# Patient Record
Sex: Female | Born: 1935 | Race: White | Hispanic: No | Smoking: Never smoker
Health system: Southern US, Community
[De-identification: ages and names within clinical notes are randomized; demographics above are authoritative.]

## PROBLEM LIST (undated history)

## (undated) DIAGNOSIS — I509 Heart failure, unspecified: Secondary | ICD-10-CM

## (undated) DIAGNOSIS — I429 Cardiomyopathy, unspecified: Secondary | ICD-10-CM

## (undated) DIAGNOSIS — M545 Low back pain: Secondary | ICD-10-CM

## (undated) DIAGNOSIS — N200 Calculus of kidney: Secondary | ICD-10-CM

## (undated) DIAGNOSIS — C50919 Malignant neoplasm of unspecified site of unspecified female breast: Secondary | ICD-10-CM

## (undated) DIAGNOSIS — M25569 Pain in unspecified knee: Secondary | ICD-10-CM

## (undated) DIAGNOSIS — R339 Retention of urine, unspecified: Secondary | ICD-10-CM

## (undated) DIAGNOSIS — N281 Cyst of kidney, acquired: Secondary | ICD-10-CM

## (undated) DIAGNOSIS — I722 Aneurysm of renal artery: Secondary | ICD-10-CM

## (undated) DIAGNOSIS — N3946 Mixed incontinence: Secondary | ICD-10-CM

## (undated) DIAGNOSIS — D3500 Benign neoplasm of unspecified adrenal gland: Secondary | ICD-10-CM

## (undated) HISTORY — PX: APPENDECTOMY: SHX54

## (undated) HISTORY — DX: Cyst of kidney, acquired: N28.1

## (undated) HISTORY — DX: Mixed incontinence: N39.46

## (undated) HISTORY — DX: Retention of urine, unspecified: R33.9

## (undated) HISTORY — DX: Heart failure, unspecified: I50.9

## (undated) HISTORY — DX: Cardiomyopathy, unspecified: I42.9

## (undated) HISTORY — DX: Low back pain: M54.5

## (undated) HISTORY — DX: Pain in unspecified knee: M25.569

## (undated) HISTORY — DX: Aneurysm of renal artery: I72.2

## (undated) HISTORY — DX: Benign neoplasm of unspecified adrenal gland: D35.00

## (undated) HISTORY — DX: Calculus of kidney: N20.0

## (undated) HISTORY — PX: BREAST SURGERY: SHX581

## (undated) HISTORY — DX: Malignant neoplasm of unspecified site of unspecified female breast: C50.919

---

## 2004-05-11 ENCOUNTER — Ambulatory Visit: Payer: Self-pay | Admitting: Oncology

## 2004-05-25 ENCOUNTER — Ambulatory Visit: Payer: Self-pay | Admitting: Oncology

## 2004-07-16 ENCOUNTER — Ambulatory Visit: Payer: Self-pay | Admitting: Unknown Physician Specialty

## 2004-08-30 ENCOUNTER — Ambulatory Visit: Payer: Self-pay | Admitting: General Surgery

## 2004-09-02 ENCOUNTER — Ambulatory Visit: Payer: Self-pay | Admitting: General Surgery

## 2004-09-21 ENCOUNTER — Ambulatory Visit: Payer: Self-pay | Admitting: General Surgery

## 2004-10-04 ENCOUNTER — Ambulatory Visit: Payer: Self-pay | Admitting: General Surgery

## 2004-10-07 ENCOUNTER — Ambulatory Visit: Payer: Self-pay | Admitting: General Surgery

## 2004-10-18 ENCOUNTER — Ambulatory Visit: Payer: Self-pay | Admitting: Oncology

## 2004-10-23 ENCOUNTER — Ambulatory Visit: Payer: Self-pay | Admitting: Oncology

## 2004-11-22 ENCOUNTER — Ambulatory Visit: Payer: Self-pay | Admitting: Oncology

## 2005-04-04 ENCOUNTER — Ambulatory Visit: Payer: Self-pay | Admitting: General Surgery

## 2005-05-10 ENCOUNTER — Ambulatory Visit: Payer: Self-pay | Admitting: Oncology

## 2005-05-26 ENCOUNTER — Ambulatory Visit: Payer: Self-pay | Admitting: Radiation Oncology

## 2005-07-04 ENCOUNTER — Ambulatory Visit: Payer: Self-pay | Admitting: Unknown Physician Specialty

## 2005-08-22 ENCOUNTER — Ambulatory Visit: Payer: Self-pay | Admitting: Unknown Physician Specialty

## 2005-09-26 ENCOUNTER — Ambulatory Visit: Payer: Self-pay | Admitting: General Surgery

## 2005-11-11 ENCOUNTER — Ambulatory Visit: Payer: Self-pay | Admitting: Oncology

## 2005-11-22 ENCOUNTER — Ambulatory Visit: Payer: Self-pay | Admitting: Oncology

## 2005-11-27 ENCOUNTER — Emergency Department: Payer: Self-pay | Admitting: Unknown Physician Specialty

## 2006-03-31 ENCOUNTER — Ambulatory Visit: Payer: Self-pay | Admitting: General Surgery

## 2006-05-12 ENCOUNTER — Ambulatory Visit: Payer: Self-pay | Admitting: Oncology

## 2006-05-25 ENCOUNTER — Ambulatory Visit: Payer: Self-pay | Admitting: Oncology

## 2006-08-10 ENCOUNTER — Ambulatory Visit: Payer: Self-pay

## 2006-09-04 ENCOUNTER — Ambulatory Visit: Payer: Self-pay | Admitting: Urology

## 2006-09-20 ENCOUNTER — Ambulatory Visit: Payer: Self-pay | Admitting: Urology

## 2006-10-02 ENCOUNTER — Ambulatory Visit: Payer: Self-pay | Admitting: General Surgery

## 2006-10-23 ENCOUNTER — Ambulatory Visit: Payer: Self-pay | Admitting: Urology

## 2006-11-08 ENCOUNTER — Ambulatory Visit: Payer: Self-pay | Admitting: Oncology

## 2006-11-23 ENCOUNTER — Ambulatory Visit: Payer: Self-pay | Admitting: Oncology

## 2006-12-11 ENCOUNTER — Ambulatory Visit: Payer: Self-pay | Admitting: General Surgery

## 2007-01-03 ENCOUNTER — Ambulatory Visit: Payer: Self-pay | Admitting: General Surgery

## 2007-04-04 ENCOUNTER — Ambulatory Visit: Payer: Self-pay | Admitting: General Surgery

## 2007-04-24 ENCOUNTER — Ambulatory Visit: Payer: Self-pay | Admitting: Urology

## 2007-04-25 ENCOUNTER — Ambulatory Visit: Payer: Self-pay | Admitting: Oncology

## 2007-05-07 ENCOUNTER — Ambulatory Visit: Payer: Self-pay | Admitting: Oncology

## 2007-05-26 ENCOUNTER — Ambulatory Visit: Payer: Self-pay | Admitting: Oncology

## 2007-07-11 ENCOUNTER — Ambulatory Visit: Payer: Self-pay | Admitting: Urology

## 2007-07-12 ENCOUNTER — Ambulatory Visit: Payer: Self-pay | Admitting: Urology

## 2007-07-27 ENCOUNTER — Ambulatory Visit: Payer: Self-pay | Admitting: Urology

## 2007-08-08 ENCOUNTER — Ambulatory Visit: Payer: Self-pay | Admitting: Urology

## 2007-08-16 ENCOUNTER — Ambulatory Visit: Payer: Self-pay | Admitting: Urology

## 2007-08-21 ENCOUNTER — Other Ambulatory Visit: Payer: Self-pay

## 2007-08-21 ENCOUNTER — Ambulatory Visit: Payer: Self-pay | Admitting: Urology

## 2007-08-23 ENCOUNTER — Ambulatory Visit: Payer: Self-pay | Admitting: Urology

## 2007-09-23 ENCOUNTER — Ambulatory Visit: Payer: Self-pay | Admitting: Oncology

## 2007-09-26 ENCOUNTER — Ambulatory Visit: Payer: Self-pay | Admitting: General Surgery

## 2007-10-24 ENCOUNTER — Ambulatory Visit: Payer: Self-pay | Admitting: Oncology

## 2007-10-29 ENCOUNTER — Ambulatory Visit: Payer: Self-pay | Admitting: Urology

## 2007-11-05 ENCOUNTER — Ambulatory Visit: Payer: Self-pay | Admitting: Oncology

## 2007-11-23 ENCOUNTER — Ambulatory Visit: Payer: Self-pay | Admitting: Oncology

## 2008-01-15 ENCOUNTER — Ambulatory Visit: Payer: Self-pay | Admitting: Unknown Physician Specialty

## 2008-03-03 ENCOUNTER — Ambulatory Visit: Payer: Self-pay | Admitting: Urology

## 2008-03-25 ENCOUNTER — Ambulatory Visit: Payer: Self-pay | Admitting: Oncology

## 2008-04-02 ENCOUNTER — Ambulatory Visit: Payer: Self-pay | Admitting: General Surgery

## 2008-05-25 ENCOUNTER — Ambulatory Visit: Payer: Self-pay | Admitting: Oncology

## 2008-05-28 ENCOUNTER — Ambulatory Visit: Payer: Self-pay | Admitting: Oncology

## 2008-06-23 ENCOUNTER — Ambulatory Visit: Payer: Self-pay | Admitting: Oncology

## 2008-06-24 ENCOUNTER — Ambulatory Visit: Payer: Self-pay | Admitting: Oncology

## 2008-07-25 ENCOUNTER — Ambulatory Visit: Payer: Self-pay | Admitting: Oncology

## 2008-09-22 ENCOUNTER — Ambulatory Visit: Payer: Self-pay | Admitting: Oncology

## 2008-09-22 ENCOUNTER — Ambulatory Visit: Payer: Self-pay | Admitting: Urology

## 2008-10-13 ENCOUNTER — Ambulatory Visit: Payer: Self-pay | Admitting: Oncology

## 2008-10-23 ENCOUNTER — Ambulatory Visit: Payer: Self-pay | Admitting: Oncology

## 2009-03-25 ENCOUNTER — Ambulatory Visit: Payer: Self-pay | Admitting: Oncology

## 2009-03-31 ENCOUNTER — Ambulatory Visit: Payer: Self-pay | Admitting: Urology

## 2009-04-06 ENCOUNTER — Ambulatory Visit: Payer: Self-pay | Admitting: Oncology

## 2009-04-07 ENCOUNTER — Ambulatory Visit: Payer: Self-pay | Admitting: General Surgery

## 2009-04-24 ENCOUNTER — Ambulatory Visit: Payer: Self-pay | Admitting: Oncology

## 2009-09-22 ENCOUNTER — Ambulatory Visit: Payer: Self-pay | Admitting: Oncology

## 2009-10-07 ENCOUNTER — Ambulatory Visit: Payer: Self-pay | Admitting: Oncology

## 2009-10-08 ENCOUNTER — Ambulatory Visit: Payer: Self-pay | Admitting: Urology

## 2009-10-10 ENCOUNTER — Emergency Department: Payer: Self-pay | Admitting: Emergency Medicine

## 2009-10-14 ENCOUNTER — Ambulatory Visit: Payer: Self-pay | Admitting: Urology

## 2009-10-23 ENCOUNTER — Ambulatory Visit: Payer: Self-pay | Admitting: Oncology

## 2009-10-29 ENCOUNTER — Ambulatory Visit: Payer: Self-pay | Admitting: Urology

## 2009-10-30 ENCOUNTER — Ambulatory Visit: Payer: Self-pay | Admitting: Urology

## 2009-11-04 ENCOUNTER — Ambulatory Visit: Payer: Self-pay | Admitting: Urology

## 2009-11-18 ENCOUNTER — Ambulatory Visit: Payer: Self-pay | Admitting: Urology

## 2010-03-25 ENCOUNTER — Ambulatory Visit: Payer: Self-pay | Admitting: Oncology

## 2010-04-05 ENCOUNTER — Ambulatory Visit: Payer: Self-pay | Admitting: Urology

## 2010-04-08 ENCOUNTER — Ambulatory Visit: Payer: Self-pay | Admitting: Oncology

## 2010-04-22 ENCOUNTER — Ambulatory Visit: Payer: Self-pay | Admitting: Oncology

## 2010-10-04 ENCOUNTER — Ambulatory Visit: Payer: Self-pay | Admitting: Urology

## 2010-10-07 ENCOUNTER — Ambulatory Visit: Payer: Self-pay | Admitting: Oncology

## 2010-10-24 ENCOUNTER — Ambulatory Visit: Payer: Self-pay | Admitting: Oncology

## 2010-11-12 ENCOUNTER — Ambulatory Visit: Payer: Self-pay | Admitting: Urology

## 2010-11-16 ENCOUNTER — Ambulatory Visit: Payer: Self-pay | Admitting: Urology

## 2010-11-19 ENCOUNTER — Ambulatory Visit: Payer: Self-pay | Admitting: Urology

## 2010-12-22 ENCOUNTER — Ambulatory Visit: Payer: Self-pay | Admitting: Urology

## 2011-01-05 ENCOUNTER — Inpatient Hospital Stay: Payer: Self-pay | Admitting: Urology

## 2011-01-18 ENCOUNTER — Ambulatory Visit: Payer: Self-pay | Admitting: Urology

## 2011-02-15 ENCOUNTER — Ambulatory Visit: Payer: Self-pay | Admitting: Urology

## 2011-02-22 ENCOUNTER — Inpatient Hospital Stay: Payer: Self-pay | Admitting: Specialist

## 2011-03-16 ENCOUNTER — Ambulatory Visit: Payer: Self-pay | Admitting: Urology

## 2011-03-24 ENCOUNTER — Ambulatory Visit: Payer: Self-pay | Admitting: Urology

## 2011-04-12 ENCOUNTER — Ambulatory Visit: Payer: Self-pay | Admitting: Urology

## 2011-04-13 ENCOUNTER — Ambulatory Visit: Payer: Self-pay | Admitting: Oncology

## 2011-04-14 ENCOUNTER — Ambulatory Visit: Payer: Self-pay | Admitting: Oncology

## 2011-04-15 LAB — CANCER ANTIGEN 27.29: CA 27.29: 23.4 U/mL (ref 0.0–38.6)

## 2011-04-21 ENCOUNTER — Ambulatory Visit: Payer: Self-pay | Admitting: Urology

## 2011-04-25 ENCOUNTER — Ambulatory Visit: Payer: Self-pay | Admitting: Oncology

## 2011-05-03 ENCOUNTER — Ambulatory Visit: Payer: Self-pay | Admitting: Urology

## 2011-06-09 ENCOUNTER — Ambulatory Visit: Payer: Self-pay | Admitting: Urology

## 2011-06-30 ENCOUNTER — Ambulatory Visit: Payer: Self-pay | Admitting: Urology

## 2011-07-11 ENCOUNTER — Ambulatory Visit: Payer: Self-pay | Admitting: Urology

## 2011-11-08 ENCOUNTER — Ambulatory Visit: Payer: Self-pay | Admitting: Urology

## 2012-03-13 ENCOUNTER — Ambulatory Visit: Payer: Self-pay | Admitting: Urology

## 2012-04-17 ENCOUNTER — Ambulatory Visit: Payer: Self-pay | Admitting: Oncology

## 2012-04-18 ENCOUNTER — Ambulatory Visit: Payer: Self-pay | Admitting: Oncology

## 2012-04-18 LAB — COMPREHENSIVE METABOLIC PANEL
Albumin: 3.5 g/dL (ref 3.4–5.0)
Alkaline Phosphatase: 108 U/L (ref 50–136)
Calcium, Total: 10 mg/dL (ref 8.5–10.1)
Co2: 26 mmol/L (ref 21–32)
EGFR (Non-African Amer.): 52 — ABNORMAL LOW
Glucose: 117 mg/dL — ABNORMAL HIGH (ref 65–99)
SGOT(AST): 23 U/L (ref 15–37)
SGPT (ALT): 25 U/L (ref 12–78)

## 2012-04-18 LAB — CBC CANCER CENTER
Eosinophil #: 0.1 x10 3/mm (ref 0.0–0.7)
Eosinophil %: 1.4 %
Lymphocyte #: 2.7 x10 3/mm (ref 1.0–3.6)
MCV: 87 fL (ref 80–100)
Monocyte %: 6.2 %
Neutrophil #: 5.4 x10 3/mm (ref 1.4–6.5)
Neutrophil %: 61 %
Platelet: 231 x10 3/mm (ref 150–440)
RBC: 5.64 10*6/uL — ABNORMAL HIGH (ref 3.80–5.20)
RDW: 14.2 % (ref 11.5–14.5)
WBC: 8.9 x10 3/mm (ref 3.6–11.0)

## 2012-04-24 ENCOUNTER — Ambulatory Visit: Payer: Self-pay | Admitting: Oncology

## 2012-09-18 ENCOUNTER — Emergency Department: Payer: Self-pay | Admitting: Emergency Medicine

## 2012-09-18 LAB — TROPONIN I: Troponin-I: <0.02 ng/mL

## 2012-09-18 LAB — BASIC METABOLIC PANEL
BUN: 14 mg/dL (ref 7–18)
Calcium, Total: 10.2 mg/dL — ABNORMAL HIGH (ref 8.5–10.1)
Co2: 25 mmol/L (ref 21–32)
Glucose: 118 mg/dL — ABNORMAL HIGH (ref 65–99)
Potassium: 3.5 mmol/L (ref 3.5–5.1)
Sodium: 141 mmol/L (ref 136–145)

## 2012-09-18 LAB — CBC
HCT: 45.8 % (ref 35.0–47.0)
MCHC: 33.3 g/dL (ref 32.0–36.0)
MCV: 85 fL (ref 80–100)
Platelet: 239 10*3/uL (ref 150–440)
RBC: 5.39 10*6/uL — ABNORMAL HIGH (ref 3.80–5.20)
WBC: 7.2 10*3/uL (ref 3.6–11.0)

## 2012-09-25 DIAGNOSIS — M545 Low back pain, unspecified: Secondary | ICD-10-CM

## 2012-09-25 DIAGNOSIS — N3946 Mixed incontinence: Secondary | ICD-10-CM

## 2012-09-25 DIAGNOSIS — D3500 Benign neoplasm of unspecified adrenal gland: Secondary | ICD-10-CM | POA: Insufficient documentation

## 2012-09-25 HISTORY — DX: Benign neoplasm of unspecified adrenal gland: D35.00

## 2012-09-25 HISTORY — DX: Low back pain, unspecified: M54.50

## 2012-09-25 HISTORY — DX: Mixed incontinence: N39.46

## 2012-09-27 ENCOUNTER — Ambulatory Visit: Payer: Self-pay | Admitting: Urology

## 2012-10-25 ENCOUNTER — Ambulatory Visit: Payer: Self-pay | Admitting: Urology

## 2012-10-26 LAB — URINE CULTURE

## 2012-11-01 ENCOUNTER — Ambulatory Visit: Payer: Self-pay | Admitting: Urology

## 2012-11-13 ENCOUNTER — Ambulatory Visit: Payer: Self-pay | Admitting: Urology

## 2012-11-22 ENCOUNTER — Ambulatory Visit: Payer: Self-pay | Admitting: Urology

## 2012-12-04 ENCOUNTER — Ambulatory Visit: Payer: Self-pay | Admitting: Urology

## 2012-12-20 ENCOUNTER — Ambulatory Visit: Payer: Self-pay | Admitting: Urology

## 2013-01-07 ENCOUNTER — Ambulatory Visit: Payer: Self-pay | Admitting: Urology

## 2013-03-08 ENCOUNTER — Other Ambulatory Visit: Payer: Self-pay | Admitting: Ophthalmology

## 2013-03-08 LAB — SEDIMENTATION RATE: Erythrocyte Sed Rate: 1 mm/hr (ref 0–30)

## 2013-03-11 ENCOUNTER — Ambulatory Visit: Payer: Self-pay | Admitting: Ophthalmology

## 2013-04-18 ENCOUNTER — Ambulatory Visit: Payer: Self-pay | Admitting: Internal Medicine

## 2013-05-06 ENCOUNTER — Ambulatory Visit: Payer: Self-pay | Admitting: Urology

## 2013-06-03 ENCOUNTER — Ambulatory Visit: Payer: Self-pay | Admitting: Urology

## 2013-06-24 ENCOUNTER — Ambulatory Visit: Payer: Self-pay | Admitting: Urology

## 2013-09-26 ENCOUNTER — Ambulatory Visit: Payer: Self-pay | Admitting: Urology

## 2013-11-18 ENCOUNTER — Ambulatory Visit: Payer: Self-pay | Admitting: Urology

## 2013-11-18 DIAGNOSIS — Z01810 Encounter for preprocedural cardiovascular examination: Secondary | ICD-10-CM

## 2013-11-18 LAB — POTASSIUM: Potassium: 4.1 mmol/L (ref 3.5–5.1)

## 2013-11-20 ENCOUNTER — Ambulatory Visit: Payer: Self-pay | Admitting: Urology

## 2013-11-20 LAB — URINE CULTURE

## 2013-11-25 ENCOUNTER — Ambulatory Visit: Payer: Self-pay | Admitting: Urology

## 2013-12-05 ENCOUNTER — Ambulatory Visit: Payer: Self-pay | Admitting: Urology

## 2013-12-09 ENCOUNTER — Ambulatory Visit: Payer: Self-pay | Admitting: Urology

## 2014-01-13 ENCOUNTER — Ambulatory Visit: Payer: Self-pay | Admitting: Urology

## 2014-01-29 DIAGNOSIS — R339 Retention of urine, unspecified: Secondary | ICD-10-CM

## 2014-01-29 HISTORY — DX: Retention of urine, unspecified: R33.9

## 2014-02-07 ENCOUNTER — Ambulatory Visit: Payer: Self-pay | Admitting: Urology

## 2014-05-01 ENCOUNTER — Ambulatory Visit: Payer: Self-pay | Admitting: Internal Medicine

## 2014-05-13 ENCOUNTER — Ambulatory Visit: Payer: Self-pay | Admitting: Internal Medicine

## 2014-11-14 ENCOUNTER — Ambulatory Visit
Admit: 2014-11-14 | Disposition: A | Discharge status: Home / Self Care | Payer: Self-pay | Attending: Internal Medicine | Admitting: Internal Medicine

## 2014-11-14 NOTE — Op Note (Signed)
PATIENT NAME:  Kelly Mcbride, Kelly Mcbride MR#:  [id number] DATE OF BIRTH:  Jan 20, 1923  DATE OF PROCEDURE:  12/20/2012  PREOPERATIVE DIAGNOSIS:  Left proximal ureteral calculus.   POSTOPERATIVE DIAGNOSIS:  Left renal calculi.   PROCEDURES:  1.  Left ureteroscopy with holmium laser lithotripsy.  2.  Placement of left ureteral stent.   SURGEON:  John Giovanni, M.D.   ASSISTANT:  None.   ANESTHESIA:  General.   INDICATIONS:  This is an 81 year old female with a history of recurrent stone disease. A KUB approximately six weeks ago was remarkable for a stone suspicious in the proximal ureter. Although asymptomatic, a renal ultrasound and CT did show hydronephrosis from a left proximal stone. She initially elected shock wave lithotripsy, which has been performed x2 with only partial fragmentation. She has elected to proceed with ureteroscopy.   DESCRIPTION OF PROCEDURE:  The patient was taken to the cystoscopy suite and placed in the supine position. General anesthetic was administered via an LMA. She was then placed in the low lithotomy position and her external genitalia were prepped and draped in the usual fashion. A timeout was performed per protocol with all in agreement. A 21-French cystoscope sheath with obturator was lubricated and passed per urethra. Panendoscopy was performed which showed normal-appearing bladder mucosa without erythema, solid or papillary lesions. The ureteral orifices were normal in appearance. A 0.035 guidewire was placed through the cystoscope and into the left ureteral orifice. The wire was easily passed up the left renal pelvis. It appeared the stone fragments had migrated from the proximal ureter to the kidney. There was also a left lower pole renal calculi present. The cystoscope was removed and a 6-French semirigid ureteroscope was passed per urethra. The left ureteral orifice was engaged without dilation. The scope was passed up the entire ureter without difficulty and no stones or  stone fragments were seen. The scope was advanced into the renal pelvis; however, the renal calculi could not be visualized. A second guidewire was placed through the rigid ureteroscope which was removed. A Navigator ureteral access sheath would not advance over the wire and buckled in the distal ureter. Over-the-wire ureteral dilators from 6 to 12-French were placed. A flexible ureteroscope was passed over the second wire; however, there was a slight narrowing in the distal ureter where the scope would not negotiate. The ureteroscope was removed. The wire  was backloaded on a cystoscope and a 10 cm ureteral dilating balloon was placed over the wire. The balloon was inflated with contrast to 18 atmospheres. There was still a slight waist. The balloon was deflated and removed. The ureteroscope was able to be placed over the wire and passed up into the renal pelvis. The mid pole stone fragments were easily visualized. A 365 micron holmium laser fiber was placed through the ureteroscope and the stone was fragmented in multiple small fragments. The scope was then placed into the lower pole calyx of the second grouping of stones. A 365 micron holmium laser fiber would not advance through the bend in the ureteroscope distally. A smaller holmium fiber could not be advanced through the ureteroscope secondary to a problem with the sheath, which could not be resolved. A 3-French nitinol basket was placed through the ureteroscope and several fragments removed, larger measuring approximately 3 mm. The ureteroscope was removed. A 6-French/24 cm Microvasive Contour stent was placed over the wire. There was good curl seen in the renal pelvis under fluoroscopy. The ureteroscope was repassed and the distal end of the stent  was well positioned in the bladder. B and O suppository was placed per rectum. The patient was taken to the PAC-U in stable condition. There were no complications. EBL was minimal.     ____________________________ [name]. Bernardo Heater, MD scs:nts D: 12/20/2012 18:28:14 ET T: 12/21/2012 05:35:15 ET JOB#: [id number]  cc: Nicki Reaper C. Bernardo Heater, MD, <Dictator> Abbie Sons MD ELECTRONICALLY SIGNED 12/24/2012 12:23

## 2014-11-15 NOTE — Op Note (Signed)
PATIENT NAME:  Kelly Mcbride, Kelly Mcbride MR#:  [id number] DATE OF BIRTH:  April 29, 1923  DATE OF PROCEDURE:  11/20/2013  PREOPERATIVE DIAGNOSIS:  Left ureteral calculi.   POSTOPERATIVE DIAGNOSIS:  Left ureteral calculi.  PROCEDURE PERFORMED:  Left ureteroscopy with a basket extraction of left ureteral calculi.   SURGEON:  John Giovanni, M.D.   ASSISTANT:  None.   ANESTHESIA:  General.   INDICATIONS:  An 81 year old female with a history of recurrent stone disease. She presented with a 1 week history of left flank pain. Stone protocol CT of the abdomen and pelvis was performed, which showed a 2 mm left distal ureteral calculus and a 5 mm left proximal ureteral calculus. There were also 2 to 3 calculi in the left renal pelvis and left calyces. She elected ureteroscopic removal.   DESCRIPTION OF PROCEDURE:  She was taken to the cystoscopy suite and placed on the table in the supine position. A general anesthetic was administered and she was then placed in the low lithotomy position and her external genitalia were prepped and draped in the usual fashion. A timeout was performed per protocol. A 21-French cystoscope sheath with obturator was lubricated and passed per urethra. Panendoscopy was performed and the bladder mucosa was normal in appearance without erythema, solid or papillary lesions. The ureteral orifices were normal appearing bilaterally and clear efflux was noted. A 2.836 hydrophilic guidewire was placed through the cystoscope and into the left ureteral orifice. Guidewire was passed up into the renal pelvis under fluoroscopic guidance without difficulty. The cystoscope was removed and a 6-French semirigid ureteroscope was passed per urethra. The left ureteral orifice was patulous and the ureteroscope was easily entered. A 2 mm stone was not seen in the distal ureter. Approximately a 4 mm stone was noted in the upper portion of the distal ureter. A 3-French nitinol basket was placed through the ureteroscope  and the stone was basket'd and removed without difficulty. Two additional calculi were seen in the proximal ureter, one measuring approximately 4 mm and another 5 mm. These were also basket'd and removed without difficulty. The ureteroscope was then advanced up to the UPJ; however, the scope could not pass through the renal pelvis. Attempts at placing a flexible ureteroscope to evaluate for the renal pelvic calculi were not successful as the scope would not pass beyond the mid ureter. Using retrograde pyelogram and fluoroscopy, definite calculi could not be seen in the renal pelvis. It was elected at this point to place a stent. The ureteroscope was removed. A 4.8-French/22 cm Contour ureteral stent was placed over the wire. There was good curl seen in the renal pelvis under fluoroscopy. The distal end of the stent was well positioned in the bladder under direct vision. A B and O suppository was placed per rectum. The patient was taken to the PACU in stable condition. There were no complications. EBL was minimal.      ____________________________ [name]. Bernardo Heater, MD scs:dmm D: 11/27/2013 10:56:24 ET T: 11/27/2013 13:14:44 ET JOB#: [id number]  cc: Nicki Reaper C. Bernardo Heater, MD, <Dictator> Abbie Sons MD ELECTRONICALLY SIGNED 12/18/2013 15:18

## 2016-04-28 DIAGNOSIS — M25569 Pain in unspecified knee: Secondary | ICD-10-CM

## 2016-04-28 HISTORY — DX: Pain in unspecified knee: M25.569

## 2016-12-13 ENCOUNTER — Ambulatory Visit
Admission: RE | Admit: 2016-12-13 | Discharge: 2016-12-13 | Disposition: A | Discharge status: Home / Self Care | Payer: Medicare Other | Source: Ambulatory Visit | Attending: Internal Medicine | Admitting: Internal Medicine

## 2016-12-13 ENCOUNTER — Other Ambulatory Visit: Payer: Self-pay | Admitting: Internal Medicine

## 2016-12-13 DIAGNOSIS — W19XXXA Unspecified fall, initial encounter: Secondary | ICD-10-CM | POA: Insufficient documentation

## 2016-12-13 DIAGNOSIS — M545 Low back pain: Secondary | ICD-10-CM | POA: Diagnosis not present

## 2016-12-13 DIAGNOSIS — R102 Pelvic and perineal pain: Secondary | ICD-10-CM | POA: Diagnosis present

## 2016-12-13 DIAGNOSIS — M549 Dorsalgia, unspecified: Secondary | ICD-10-CM

## 2016-12-13 DIAGNOSIS — N200 Calculus of kidney: Secondary | ICD-10-CM | POA: Insufficient documentation

## 2017-01-10 ENCOUNTER — Encounter: Payer: Self-pay | Admitting: Internal Medicine

## 2017-01-10 ENCOUNTER — Ambulatory Visit (INDEPENDENT_AMBULATORY_CARE_PROVIDER_SITE_OTHER): Payer: Medicare Other | Admitting: Internal Medicine

## 2017-01-10 DIAGNOSIS — C50919 Malignant neoplasm of unspecified site of unspecified female breast: Secondary | ICD-10-CM | POA: Insufficient documentation

## 2017-01-10 DIAGNOSIS — I722 Aneurysm of renal artery: Secondary | ICD-10-CM | POA: Insufficient documentation

## 2017-01-10 DIAGNOSIS — N200 Calculus of kidney: Secondary | ICD-10-CM | POA: Diagnosis not present

## 2017-01-10 DIAGNOSIS — Z9049 Acquired absence of other specified parts of digestive tract: Secondary | ICD-10-CM | POA: Insufficient documentation

## 2017-01-10 DIAGNOSIS — N281 Cyst of kidney, acquired: Secondary | ICD-10-CM | POA: Insufficient documentation

## 2017-01-10 HISTORY — DX: Calculus of kidney: N20.0

## 2017-01-10 HISTORY — DX: Cyst of kidney, acquired: N28.1

## 2017-01-10 HISTORY — DX: Malignant neoplasm of unspecified site of unspecified female breast: C50.919

## 2017-01-10 HISTORY — DX: Aneurysm of renal artery: I72.2

## 2017-01-10 NOTE — Progress Notes (Signed)
[facility] for Infectious Disease      Reason for Consult: positive urine culture   Referring Physician: Dr. Bernardo Heater    Patient ID: Kelly Mcbride, female    DOB: 11/08/1922, 83 y.o.   MRN: [id number]  HPI:   She comes in at the request of above.  She has a history of recurrent kidney stones seen by urology in 2015 and stone passed. She was rerefered to Dr. Bernardo Heater by her PCP Dr. Hall Busing for ? Infection with about 2 weeks of intermittent confusion above her baseline and a urinalysis was sent and culture positive for mixed flora.  She was started on cipro empirically.  She was having some pelvic discomort but this was not new.  She was having no fever, no chills.  Her daughter states she did have an elevated peripheral WBC level.  She then went to urology and had a cT scan which noted non obstructive stones.  She had a stent placed and is unsure if stones have passed.  She has not been filtering her urine.  No fever or chills since then.   Previous record reviewed as above.  Urine culture grew a Corynebacterium species < 100,000.  UA with WBCs and blood.    PMH: dementia, renal cysts, anemia, calculus of kidney, skin cancer  Prior to Admission medications   Medication Sig Start Date End Date Taking? Authorizing Provider  acetaminophen (TYLENOL) 325 MG tablet Take by mouth daily.   Yes [provider]  Ascorbic Acid (VITAMIN C) 1000 MG tablet Take 1,000 mg by mouth. 09/25/12  Yes [provider]  bimatoprost (LUMIGAN) 0.01 % SOLN Lumigan 0.01 % eye drops   Yes [provider]  docusate sodium (COLACE) 100 MG capsule Take 100 mg by mouth 2 (two) times daily.   Yes [provider]  metroNIDAZOLE (METROCREAM) 0.75 % cream  12/31/13  Yes [provider]  Omega-3 Fatty Acids (FISH OIL PO) Take by mouth.   Yes [provider]  omeprazole (PRILOSEC) 20 MG capsule  11/01/13  Yes [provider]  vitamin E (TH VITAMIN E) 1000 UNIT capsule  Take by mouth. 09/25/12  Yes [provider]    Allergies  Allergen Reactions  . Sulfa Antibiotics Hives and Rash  . Aspirin   . Penicillins     Other reaction(s): Other (See Comments) Other reaction(s): Unknown    Social History  Substance Use Topics  . Smoking status: Never Smoker  . Smokeless tobacco: Never Used  . Alcohol use No    FMH: no renal disease  Review of Systems  Constitutional: negative for fevers, chills and fatigue Cardiovascular: negative for chest pressure/discomfort Gastrointestinal: negative for diarrhea Integument/breast: negative for rash All other systems reviewed and are negative    Constitutional: in no apparent distress and alert  Vitals:   01/10/17 1353  BP: 126/77  Pulse: (!) 106  Temp: 98.5 F (36.9 C)   EYES: anicteric ENMT:no thrush Cardiovascular: Cor RRR Respiratory: CTA B; normal respiratory effort GI: Bowel sounds are normal, liver is not enlarged, spleen is not enlarged Musculoskeletal: no pedal edema noted Skin: negatives: no rash  Labs: Lab Results  Component Value Date   WBC 7.2 09/18/2012   HGB 15.3 09/18/2012   HCT 45.8 09/18/2012   MCV 85 09/18/2012   PLT 239 09/18/2012    Lab Results  Component Value Date   CREATININE 0.80 09/18/2012   BUN 14 09/18/2012   NA 141 09/18/2012  K 4.1 11/18/2013   CL 107 09/18/2012   CO2 25 09/18/2012    Lab Results  Component Value Date   ALT 25 04/18/2012   AST 23 04/18/2012   ALKPHOS 108 04/18/2012   BILITOT 0.4 04/18/2012     Assessment: urine colonization.  I do not appreciate any signs of infection at this time or from the history.  UA and culture noted and in this setting would be consistent with colonization.  Her history of a urinary source causing confusion would be inconsistent without concurrent systemic signs such as high fever, CVA tenderness, significantly elevated WBC.  Therefore no treatment or work up indicated.   Plan: 1) continue supportive  care, no infectious etiology at this time 2) no indication to check urinalysis without concerns for infection such as fever, new pelvic discomfort and no other source.    Thanks for the referral, call with questions.

## 2017-05-02 ENCOUNTER — Other Ambulatory Visit: Payer: Self-pay | Admitting: Internal Medicine

## 2017-05-02 ENCOUNTER — Ambulatory Visit
Admission: RE | Admit: 2017-05-02 | Discharge: 2017-05-02 | Disposition: A | Discharge status: Home / Self Care | Payer: Medicare Other | Source: Ambulatory Visit | Attending: Internal Medicine | Admitting: Internal Medicine

## 2017-05-02 DIAGNOSIS — M79605 Pain in left leg: Secondary | ICD-10-CM | POA: Diagnosis present

## 2017-05-25 ENCOUNTER — Other Ambulatory Visit: Payer: Self-pay | Admitting: Urology

## 2017-05-25 DIAGNOSIS — N200 Calculus of kidney: Secondary | ICD-10-CM

## 2017-05-29 ENCOUNTER — Ambulatory Visit
Admission: RE | Admit: 2017-05-29 | Discharge: 2017-05-29 | Disposition: A | Discharge status: Home / Self Care | Payer: Medicare Other | Source: Ambulatory Visit | Attending: Urology | Admitting: Urology

## 2017-05-29 DIAGNOSIS — N200 Calculus of kidney: Secondary | ICD-10-CM | POA: Diagnosis not present

## 2017-05-29 DIAGNOSIS — I722 Aneurysm of renal artery: Secondary | ICD-10-CM | POA: Diagnosis not present

## 2017-05-31 ENCOUNTER — Telehealth: Payer: Self-pay

## 2017-05-31 NOTE — Telephone Encounter (Signed)
-----   Message from Abbie Sons, MD sent at 05/30/2017 12:41 PM EST ----- KUB reviewed.  The right ureteral calculus has not progressed and based on size unlikely she will be able to pass.  Would recommend appointment to discuss treatment options.

## 2017-05-31 NOTE — Telephone Encounter (Signed)
Spoke with pt daughter, Olivia Mackie, in reference to KUB results and needing an OV to discuss tx options. Olivia Mackie voiced understanding. F/u appt was made.

## 2017-06-07 ENCOUNTER — Ambulatory Visit (INDEPENDENT_AMBULATORY_CARE_PROVIDER_SITE_OTHER): Payer: Medicare Other | Admitting: Urology

## 2017-06-07 ENCOUNTER — Encounter: Payer: Self-pay | Admitting: Urology

## 2017-06-07 VITALS — BP 133/79 | HR 93 | Ht 63.0 in | Wt 115.0 lb

## 2017-06-07 DIAGNOSIS — I5033 Acute on chronic diastolic (congestive) heart failure: Secondary | ICD-10-CM | POA: Insufficient documentation

## 2017-06-07 DIAGNOSIS — N201 Calculus of ureter: Secondary | ICD-10-CM | POA: Diagnosis not present

## 2017-06-07 DIAGNOSIS — I509 Heart failure, unspecified: Secondary | ICD-10-CM

## 2017-06-07 DIAGNOSIS — N200 Calculus of kidney: Secondary | ICD-10-CM

## 2017-06-07 DIAGNOSIS — I429 Cardiomyopathy, unspecified: Secondary | ICD-10-CM | POA: Insufficient documentation

## 2017-06-07 HISTORY — DX: Cardiomyopathy, unspecified: I42.9

## 2017-06-07 HISTORY — DX: Heart failure, unspecified: I50.9

## 2017-06-07 NOTE — Progress Notes (Signed)
06/07/2017 7:56 PM   Kelly Mcbride [birth year] [id number]  Referring provider: Albina Billet, MD [address]   [place], [place] [address]  Chief Complaint  Patient presents with  . Nephrolithiasis    treatment options    HPI: Kelly Mcbride is a 83 year old female with a long history of recurrent stone disease.  She has had multiple ureteroscopic stone removal and previous shockwave lithotripsy over the years.  Her last procedure was a left USE at [facility] in July 2018.  Her daughter called in September 2018 stating her mother was having some abdominal pain.  A stone protocol CT of the abdomen and pelvis was performed on 9/20 which showed a nonobstructing 6 mm right distal ureteral calculus and a nonobstructing 3 mm left ureteral calculus.  Due to minimal symptoms and lack of obstruction she elected a trial of passage.  She has mild pelvic discomfort but no severe renal colic.  She did pass a small stone several weeks ago.  Recent KUB shows a persistent right distal ureteral calculus.   PMH: Past Medical History:  Diagnosis Date  . 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 Breast cancer ([facility]) 01/10/2017  . Cardiomyopathy, secondary ([facility]) 06/07/2017  . CHF (congestive heart failure) ([facility]) 06/07/2017  . Incomplete emptying of bladder 01/29/2014  . Kidney cyst, acquired 01/10/2017  . Knee pain 04/28/2016  . Low back pain 09/25/2012  . Mixed urge and stress incontinence 09/25/2012  . Nephrolithiasis 01/10/2017    Surgical History: Past Surgical History:  Procedure Laterality Date  . APPENDECTOMY      Home Medications:  Allergies as of 06/07/2017      Reactions   Sulfa Antibiotics Hives, Rash   Aspirin    Ciprofloxacin    Other reaction(s): Other (See Comments) Per patient she get upset stomach and dizziness.    Ibuprofen    Other reaction(s): Other (See Comments) Because of hiatal hernia  Other reaction(s): Other (See  Comments) Because of hiatal hernia    Penicillins    Other reaction(s): Other (See Comments) Other reaction(s): Unknown      Medication List        Accurate as of 06/07/17  7:56 PM. Always use your most recent med list.          acetaminophen 325 MG tablet Commonly known as:  TYLENOL Take by mouth daily.   docusate sodium 100 MG capsule Commonly known as:  COLACE Take 100 mg by mouth 2 (two) times daily.   FISH OIL PO Take by mouth.   LUMIGAN 0.01 % Soln Generic drug:  bimatoprost Lumigan 0.01 % eye drops   metroNIDAZOLE 0.75 % cream Commonly known as:  METROCREAM   omeprazole 20 MG capsule Commonly known as:  PRILOSEC   TH VITAMIN E 1000 UNIT capsule Generic drug:  vitamin E Take by mouth.   vitamin C 1000 MG tablet Take 1,000 mg by mouth.       Allergies:  Allergies  Allergen Reactions  . Sulfa Antibiotics Hives and Rash  . Aspirin   . Ciprofloxacin     Other reaction(s): Other (See Comments) Per patient she get upset stomach and dizziness.   . Ibuprofen     Other reaction(s): Other (See Comments) Because of hiatal hernia  Other reaction(s): Other (See Comments) Because of hiatal hernia    . Penicillins     Other reaction(s): Other (See Comments) Other reaction(s): Unknown  Family History: No family history on file.  Social History:  reports that  has never smoked. she has never used smokeless tobacco. She reports that she does not drink alcohol or use drugs.  ROS: UROLOGY Frequent Urination?: Yes Hard to postpone urination?: No Burning/pain with urination?: Yes Get up at night to urinate?: Yes Leakage of urine?: No Urine stream starts and stops?: Yes Trouble starting stream?: Yes Do you have to strain to urinate?: No Blood in urine?: No Urinary tract infection?: No Sexually transmitted disease?: No Injury to kidneys or bladder?: No Painful intercourse?: No Weak stream?: Yes Currently pregnant?: No Vaginal bleeding?: No Last  menstrual period?: n  Gastrointestinal Nausea?: No Vomiting?: No Indigestion/heartburn?: No Diarrhea?: No Constipation?: No  Constitutional Fever: No Night sweats?: No Weight loss?: No Fatigue?: No  Skin Skin rash/lesions?: No Itching?: No  Eyes Blurred vision?: No Double vision?: No  Ears/Nose/Throat Sore throat?: No Sinus problems?: Yes  Hematologic/Lymphatic Swollen glands?: No Easy bruising?: No  Cardiovascular Leg swelling?: No Chest pain?: No  Respiratory Cough?: No Shortness of breath?: No  Endocrine Excessive thirst?: No  Musculoskeletal Back pain?: Yes Joint pain?: No  Neurological Headaches?: No Dizziness?: Yes  Psychologic Depression?: Yes Anxiety?: No  Physical Exam: BP 133/79   Pulse 93   Ht 5\' 3"  (1.6 m)   Wt 115 lb (52.2 kg)   BMI 20.37 kg/m   Constitutional:  Alert and oriented, No acute distress. HEENT: [place] AT, moist mucus membranes.  Trachea midline, no masses. Cardiovascular: No clubbing, cyanosis, or edema.  CV RRR Respiratory: Normal respiratory effort, no increased work of breathing.  Lungs clear GI: Abdomen is soft, nontender, nondistended, no abdominal masses GU: No CVA tenderness. Skin: No rashes, bruises or suspicious lesions. Lymph: No cervical or inguinal adenopathy. Neurologic: Grossly intact, no focal deficits, moving all 4 extremities. Psychiatric: Normal mood and affect.  Laboratory Data: Lab Results  Component Value Date   WBC 7.2 09/18/2012   HGB 15.3 09/18/2012   HCT 45.8 09/18/2012   MCV 85 09/18/2012   PLT 239 09/18/2012    Lab Results  Component Value Date   CREATININE 0.80 09/18/2012    Urinalysis Dipstick trace blood/2+ leukocytes Microscopy 6-10 WBC  Pertinent imaging:  Results for orders placed during the hospital encounter of 05/29/17  Abdomen 1 view (KUB)   Narrative CLINICAL DATA:  Followup left-sided kidney stone, previous stent placement in July, no current  symptoms  EXAM: ABDOMEN - 1 VIEW  COMPARISON:  Abdomen films of 12/13/2016, and CT abdomen pelvis of 02/22/2011  FINDINGS: The oblong calculus noted previously overlying the lower pole of the right kidney is unchanged as are probable additional right renal calculi. There also may be a right renal artery aneurysm more medially. Calcifications overlie the mid lower left kidney as well most consistent with left lower pole renal calculi. The double-J stent noted previously has been removed. However, there is a calcification in the lower right bony pelvis not definitely seen previously. A distal right ureteral calculus cannot be excluded. Bowel gas pattern is nonspecific. There is a moderate amount of feces throughout the colon. The bones are osteopenic.  IMPRESSION: 1. Calcification low in the right bony pelvis not seen previously could represent a distal right ureteral calculus. Consider CT the abdomen pelvis if further assessment is warranted. 2. No change in bilateral renal calculi. 3. Calcified right renal artery aneurysm as noted on prior CT the abdomen pelvis from 2012.   Electronically Signed   By: Eddie Dibbles  Alvester Chou M.D.   On: 05/30/2017 08:43     Assessment & Plan:    1. Calculus of ureter Nonobstructing right distal ureteral calculus.  History consistent with passing her small left ureteral calculus.  Based on stone size and nonprogression is [place] and her daughter informed it is unlikely she will pass the stone.  Stone density on CT was 700-800 HU.  We discussed ureteroscopic removal which would have a success rate of greater than 95% and shockwave lithotripsy which would have a success rate of approximately 85%.  Her daughter is very concerned about general anesthesia with ureteroscopy as she feels her memory has worsened after each anesthetic.  They would like to schedule shockwave lithotripsy.  The indications and nature of the planned procedure were discussed as well as  the potential benefits and expected outcome.  Alternatives were discussed as described above.  The most common complications and side effects were discussed as outlined in the [facility] consent form.  It was stressed that there is no guarantee that lithotripsy will be successful and she could require retreatment or alternative treatment.  The rare instance of perirenal bleeding requiring hospitalization, transfusion and rarely surgery were discussed.  The possibility of renal colic from obstructing stone fragments requiring stent placement or ureteroscopy was also discussed.   2. Nephrolithiasis  - Urinalysis, Complete    Abbie Sons, MD  [facility] [address] [place], [place] [address] [phone]

## 2017-06-08 LAB — URINALYSIS, COMPLETE
Bilirubin, UA: NEGATIVE
GLUCOSE, UA: NEGATIVE
KETONES UA: NEGATIVE
Nitrite, UA: NEGATIVE
Protein, UA: NEGATIVE
SPEC GRAV UA: 1.015 (ref 1.005–1.030)
Urobilinogen, Ur: 0.2 mg/dL (ref 0.2–1.0)
pH, UA: 5.5 (ref 5.0–7.5)

## 2017-06-08 LAB — MICROSCOPIC EXAMINATION

## 2017-06-27 ENCOUNTER — Other Ambulatory Visit: Payer: Medicare Other

## 2017-06-27 ENCOUNTER — Other Ambulatory Visit: Payer: Self-pay | Admitting: Radiology

## 2017-06-27 DIAGNOSIS — N201 Calculus of ureter: Secondary | ICD-10-CM

## 2017-06-27 LAB — MICROSCOPIC EXAMINATION

## 2017-06-27 LAB — URINALYSIS, COMPLETE
BILIRUBIN UA: NEGATIVE
Glucose, UA: NEGATIVE
KETONES UA: NEGATIVE
NITRITE UA: NEGATIVE
PH UA: 5.5 (ref 5.0–7.5)
Protein, UA: NEGATIVE
SPEC GRAV UA: 1.02 (ref 1.005–1.030)
Urobilinogen, Ur: 0.2 mg/dL (ref 0.2–1.0)

## 2017-06-28 ENCOUNTER — Other Ambulatory Visit: Payer: Self-pay | Admitting: Radiology

## 2017-06-28 DIAGNOSIS — N201 Calculus of ureter: Secondary | ICD-10-CM

## 2017-06-29 ENCOUNTER — Other Ambulatory Visit: Payer: Self-pay | Admitting: Urology

## 2017-06-29 LAB — CULTURE, URINE COMPREHENSIVE

## 2017-06-29 MED ORDER — CEFUROXIME AXETIL 500 MG PO TABS: 500.0000 mg | ORAL_TABLET | Freq: Two times a day (BID) | ORAL | 0 refills | Status: AC

## 2017-07-06 ENCOUNTER — Ambulatory Visit
Admission: RE | Admit: 2017-07-06 | Discharge: 2017-07-06 | Disposition: A | Discharge status: Home / Self Care | Payer: Medicare Other | Source: Ambulatory Visit | Attending: Urology | Admitting: Urology

## 2017-07-06 ENCOUNTER — Other Ambulatory Visit: Payer: Self-pay

## 2017-07-06 ENCOUNTER — Ambulatory Visit: Payer: Medicare Other

## 2017-07-06 ENCOUNTER — Encounter
Admission: RE | Disposition: A | Discharge status: Home / Self Care | Payer: Self-pay | Source: Ambulatory Visit | Attending: Urology

## 2017-07-06 DIAGNOSIS — Z538 Procedure and treatment not carried out for other reasons: Secondary | ICD-10-CM | POA: Diagnosis not present

## 2017-07-06 DIAGNOSIS — N201 Calculus of ureter: Secondary | ICD-10-CM

## 2017-07-06 DIAGNOSIS — N200 Calculus of kidney: Secondary | ICD-10-CM | POA: Insufficient documentation

## 2017-07-06 HISTORY — PX: EXTRACORPOREAL SHOCK WAVE LITHOTRIPSY: SHX1557

## 2017-07-06 SURGERY — LITHOTRIPSY, ESWL
Anesthesia: Moderate Sedation | Laterality: Right

## 2017-07-06 MED ORDER — DEXTROSE 5 % IV SOLN
1.0000 g | Freq: Once | INTRAVENOUS | Status: DC
  Filled 2017-07-06: qty 10

## 2017-07-06 MED ORDER — ONDANSETRON HCL 4 MG/2ML IJ SOLN
4.0000 mg | Freq: Once | INTRAMUSCULAR | Status: AC
  Administered 2017-07-06: 4 mg via INTRAVENOUS

## 2017-07-06 MED ORDER — ONDANSETRON HCL 4 MG/2ML IJ SOLN
INTRAMUSCULAR | Status: AC
  Administered 2017-07-06: 4 mg via INTRAVENOUS
  Filled 2017-07-06: qty 2

## 2017-07-06 MED ORDER — SODIUM CHLORIDE 0.9 % IV SOLN
INTRAVENOUS | Status: DC
  Administered 2017-07-06: 09:00:00 via INTRAVENOUS

## 2017-07-06 MED ORDER — DIPHENHYDRAMINE HCL 25 MG PO CAPS: 25.0000 mg | ORAL_CAPSULE | ORAL | Status: DC

## 2017-07-06 MED ORDER — DIAZEPAM 5 MG PO TABS: 10.0000 mg | ORAL_TABLET | ORAL | Status: DC

## 2017-07-06 NOTE — OR Nursing (Signed)
No visible stone on xray per MD, procedure canceled, MD at bedside and discussed with family and patient

## 2017-07-06 NOTE — H&P (Signed)
Procedure cancelled.  Stone no longer present.

## 2017-07-07 ENCOUNTER — Encounter: Payer: Self-pay | Admitting: Urology

## 2017-07-20 ENCOUNTER — Ambulatory Visit: Payer: Medicare Other | Admitting: Urology

## 2017-09-14 ENCOUNTER — Telehealth: Payer: Self-pay | Admitting: Urology

## 2017-09-14 DIAGNOSIS — N200 Calculus of kidney: Secondary | ICD-10-CM

## 2017-09-14 NOTE — Telephone Encounter (Signed)
In the past pt has had side pain and [facility] has ordered a KUB prior to pt coming in.  [name] said she's been complaining of pain in her side since Tuesday and thinks it may be another kidney stone.  Kelly Mcbride [phone]

## 2017-09-17 NOTE — Telephone Encounter (Signed)
Okay to order a KUB.  Will call with results

## 2017-09-22 NOTE — Telephone Encounter (Signed)
LMOM and orders placed.

## 2017-10-26 ENCOUNTER — Ambulatory Visit
Admission: RE | Admit: 2017-10-26 | Discharge: 2017-10-26 | Disposition: A | Discharge status: Home / Self Care | Payer: Medicare Other | Source: Ambulatory Visit | Attending: Urology | Admitting: Urology

## 2017-10-26 DIAGNOSIS — N200 Calculus of kidney: Secondary | ICD-10-CM | POA: Diagnosis present

## 2017-10-30 ENCOUNTER — Telehealth: Payer: Self-pay | Admitting: Urology

## 2017-10-30 NOTE — Telephone Encounter (Signed)
-----   Message from Abbie Sons, MD sent at 10/27/2017  1:16 PM EDT ----- KUB shows bilateral renal calculi.  I do not see any evidence of ureteral calculi.

## 2017-10-30 NOTE — Telephone Encounter (Signed)
Pt daughter calling to get KUB results, asking her mom has kidney stones or not. Please advise.  Thanks.

## 2017-10-30 NOTE — Telephone Encounter (Signed)
Letter sent.

## 2017-11-07 ENCOUNTER — Emergency Department: Payer: Medicare Other

## 2017-11-07 ENCOUNTER — Encounter: Payer: Self-pay | Admitting: Emergency Medicine

## 2017-11-07 ENCOUNTER — Observation Stay
Admission: EM | Admit: 2017-11-07 | Discharge: 2017-11-09 | Disposition: A | Discharge status: Home / Self Care | Payer: Medicare Other | Attending: Internal Medicine | Admitting: Internal Medicine

## 2017-11-07 ENCOUNTER — Other Ambulatory Visit: Payer: Self-pay

## 2017-11-07 DIAGNOSIS — Z886 Allergy status to analgesic agent status: Secondary | ICD-10-CM | POA: Insufficient documentation

## 2017-11-07 DIAGNOSIS — Z66 Do not resuscitate: Secondary | ICD-10-CM | POA: Insufficient documentation

## 2017-11-07 DIAGNOSIS — R748 Abnormal levels of other serum enzymes: Secondary | ICD-10-CM | POA: Diagnosis present

## 2017-11-07 DIAGNOSIS — R410 Disorientation, unspecified: Secondary | ICD-10-CM | POA: Diagnosis present

## 2017-11-07 DIAGNOSIS — S2241XA Multiple fractures of ribs, right side, initial encounter for closed fracture: Secondary | ICD-10-CM | POA: Diagnosis not present

## 2017-11-07 DIAGNOSIS — S0990XA Unspecified injury of head, initial encounter: Secondary | ICD-10-CM | POA: Insufficient documentation

## 2017-11-07 DIAGNOSIS — Z853 Personal history of malignant neoplasm of breast: Secondary | ICD-10-CM | POA: Insufficient documentation

## 2017-11-07 DIAGNOSIS — I214 Non-ST elevation (NSTEMI) myocardial infarction: Secondary | ICD-10-CM | POA: Diagnosis present

## 2017-11-07 DIAGNOSIS — W19XXXA Unspecified fall, initial encounter: Secondary | ICD-10-CM | POA: Insufficient documentation

## 2017-11-07 DIAGNOSIS — N179 Acute kidney failure, unspecified: Secondary | ICD-10-CM | POA: Diagnosis not present

## 2017-11-07 DIAGNOSIS — N200 Calculus of kidney: Secondary | ICD-10-CM | POA: Diagnosis not present

## 2017-11-07 DIAGNOSIS — K449 Diaphragmatic hernia without obstruction or gangrene: Secondary | ICD-10-CM | POA: Insufficient documentation

## 2017-11-07 DIAGNOSIS — Z9010 Acquired absence of unspecified breast and nipple: Secondary | ICD-10-CM | POA: Diagnosis not present

## 2017-11-07 DIAGNOSIS — Z882 Allergy status to sulfonamides status: Secondary | ICD-10-CM | POA: Diagnosis not present

## 2017-11-07 DIAGNOSIS — R7989 Other specified abnormal findings of blood chemistry: Secondary | ICD-10-CM

## 2017-11-07 DIAGNOSIS — I110 Hypertensive heart disease with heart failure: Secondary | ICD-10-CM | POA: Diagnosis not present

## 2017-11-07 DIAGNOSIS — F0390 Unspecified dementia without behavioral disturbance: Secondary | ICD-10-CM | POA: Diagnosis not present

## 2017-11-07 DIAGNOSIS — Z79899 Other long term (current) drug therapy: Secondary | ICD-10-CM | POA: Insufficient documentation

## 2017-11-07 DIAGNOSIS — R000 Tachycardia, unspecified: Secondary | ICD-10-CM | POA: Insufficient documentation

## 2017-11-07 DIAGNOSIS — H409 Unspecified glaucoma: Secondary | ICD-10-CM | POA: Insufficient documentation

## 2017-11-07 DIAGNOSIS — R2681 Unsteadiness on feet: Secondary | ICD-10-CM | POA: Insufficient documentation

## 2017-11-07 DIAGNOSIS — N3000 Acute cystitis without hematuria: Secondary | ICD-10-CM | POA: Insufficient documentation

## 2017-11-07 DIAGNOSIS — Z881 Allergy status to other antibiotic agents status: Secondary | ICD-10-CM | POA: Diagnosis not present

## 2017-11-07 DIAGNOSIS — E860 Dehydration: Secondary | ICD-10-CM | POA: Insufficient documentation

## 2017-11-07 DIAGNOSIS — Z87442 Personal history of urinary calculi: Secondary | ICD-10-CM | POA: Insufficient documentation

## 2017-11-07 DIAGNOSIS — Z6821 Body mass index (BMI) 21.0-21.9, adult: Secondary | ICD-10-CM | POA: Insufficient documentation

## 2017-11-07 DIAGNOSIS — I509 Heart failure, unspecified: Secondary | ICD-10-CM | POA: Insufficient documentation

## 2017-11-07 DIAGNOSIS — E440 Moderate protein-calorie malnutrition: Secondary | ICD-10-CM | POA: Diagnosis not present

## 2017-11-07 DIAGNOSIS — M8580 Other specified disorders of bone density and structure, unspecified site: Secondary | ICD-10-CM | POA: Diagnosis not present

## 2017-11-07 DIAGNOSIS — Z880 Allergy status to penicillin: Secondary | ICD-10-CM | POA: Insufficient documentation

## 2017-11-07 DIAGNOSIS — I700 Atherosclerosis of aorta: Secondary | ICD-10-CM | POA: Insufficient documentation

## 2017-11-07 DIAGNOSIS — I429 Cardiomyopathy, unspecified: Secondary | ICD-10-CM | POA: Diagnosis not present

## 2017-11-07 DIAGNOSIS — R52 Pain, unspecified: Secondary | ICD-10-CM

## 2017-11-07 DIAGNOSIS — R778 Other specified abnormalities of plasma proteins: Secondary | ICD-10-CM

## 2017-11-07 LAB — CBC
HEMATOCRIT: 44.1 % (ref 35.0–47.0)
HEMOGLOBIN: 14.9 g/dL (ref 12.0–16.0)
MCH: 29.4 pg (ref 26.0–34.0)
MCHC: 33.7 g/dL (ref 32.0–36.0)
MCV: 87 fL (ref 80.0–100.0)
Platelets: 282 10*3/uL (ref 150–440)
RBC: 5.06 MIL/uL (ref 3.80–5.20)
RDW: 13.6 % (ref 11.5–14.5)
WBC: 10.4 10*3/uL (ref 3.6–11.0)

## 2017-11-07 LAB — COMPREHENSIVE METABOLIC PANEL
ALBUMIN: 3.2 g/dL — AB (ref 3.5–5.0)
ALK PHOS: 112 U/L (ref 38–126)
ALT: 24 U/L (ref 14–54)
ANION GAP: 7 (ref 5–15)
AST: 22 U/L (ref 15–41)
BILIRUBIN TOTAL: 0.8 mg/dL (ref 0.3–1.2)
BUN: 20 mg/dL (ref 6–20)
CALCIUM: 10.4 mg/dL — AB (ref 8.9–10.3)
CO2: 26 mmol/L (ref 22–32)
Chloride: 105 mmol/L (ref 101–111)
Creatinine, Ser: 1.1 mg/dL — ABNORMAL HIGH (ref 0.44–1.00)
GFR calc non Af Amer: 42 mL/min — ABNORMAL LOW (ref >60)
GFR, EST AFRICAN AMERICAN: 49 mL/min — AB (ref >60)
GLUCOSE: 118 mg/dL — AB (ref 65–99)
POTASSIUM: 3.8 mmol/L (ref 3.5–5.1)
SODIUM: 138 mmol/L (ref 135–145)
TOTAL PROTEIN: 7.1 g/dL (ref 6.5–8.1)

## 2017-11-07 LAB — TROPONIN I: TROPONIN I: 0.06 ng/mL — AB (ref <0.03)

## 2017-11-07 MED ORDER — ONDANSETRON HCL 4 MG PO TABS: 4.0000 mg | ORAL_TABLET | Freq: Four times a day (QID) | ORAL | Status: DC | PRN

## 2017-11-07 MED ORDER — SODIUM CHLORIDE 0.9 % IV SOLN
Freq: Once | INTRAVENOUS | Status: AC
  Administered 2017-11-07: 19:00:00 via INTRAVENOUS

## 2017-11-07 MED ORDER — TRAZODONE HCL 50 MG PO TABS: 25.0000 mg | ORAL_TABLET | Freq: Every evening | ORAL | Status: DC | PRN

## 2017-11-07 MED ORDER — SODIUM CHLORIDE 0.9 % IV SOLN
Freq: Once | INTRAVENOUS | Status: AC
  Administered 2017-11-07: 23:00:00 via INTRAVENOUS

## 2017-11-07 MED ORDER — DORZOLAMIDE HCL 2 % OP SOLN
1.0000 [drp] | Freq: Two times a day (BID) | OPHTHALMIC | Status: DC
  Administered 2017-11-07 – 2017-11-09 (×4): 1 [drp] via OPHTHALMIC
  Filled 2017-11-07: qty 10

## 2017-11-07 MED ORDER — BISACODYL 5 MG PO TBEC: 5.0000 mg | DELAYED_RELEASE_TABLET | Freq: Every day | ORAL | Status: DC | PRN

## 2017-11-07 MED ORDER — VITAMIN C 500 MG PO TABS
1000.0000 mg | ORAL_TABLET | Freq: Every day | ORAL | Status: DC
  Administered 2017-11-08: 1000 mg via ORAL
  Filled 2017-11-07: qty 2

## 2017-11-07 MED ORDER — ONDANSETRON HCL 4 MG/2ML IJ SOLN: 4.0000 mg | Freq: Four times a day (QID) | INTRAMUSCULAR | Status: DC | PRN

## 2017-11-07 MED ORDER — LATANOPROST 0.005 % OP SOLN
1.0000 [drp] | Freq: Every day | OPHTHALMIC | Status: DC
  Administered 2017-11-07 – 2017-11-08 (×2): 1 [drp] via OPHTHALMIC
  Filled 2017-11-07: qty 2.5

## 2017-11-07 MED ORDER — ACETAMINOPHEN 650 MG RE SUPP: 650.0000 mg | Freq: Four times a day (QID) | RECTAL | Status: DC | PRN

## 2017-11-07 MED ORDER — PANTOPRAZOLE SODIUM 40 MG PO TBEC
40.0000 mg | DELAYED_RELEASE_TABLET | Freq: Every day | ORAL | Status: DC
  Administered 2017-11-08 – 2017-11-09 (×2): 40 mg via ORAL
  Filled 2017-11-07 (×2): qty 1

## 2017-11-07 MED ORDER — DOCUSATE SODIUM 100 MG PO CAPS
100.0000 mg | ORAL_CAPSULE | Freq: Two times a day (BID) | ORAL | Status: DC
  Administered 2017-11-08 – 2017-11-09 (×3): 100 mg via ORAL
  Filled 2017-11-07 (×3): qty 1

## 2017-11-07 MED ORDER — HYDROCODONE-ACETAMINOPHEN 5-325 MG PO TABS: 1.0000 | ORAL_TABLET | ORAL | Status: DC | PRN

## 2017-11-07 MED ORDER — ACETAMINOPHEN 325 MG PO TABS
650.0000 mg | ORAL_TABLET | Freq: Four times a day (QID) | ORAL | Status: DC | PRN
  Administered 2017-11-08 – 2017-11-09 (×2): 650 mg via ORAL
  Filled 2017-11-07 (×2): qty 2

## 2017-11-07 MED ORDER — OMEGA-3-ACID ETHYL ESTERS 1 G PO CAPS
1.0000 g | ORAL_CAPSULE | Freq: Every day | ORAL | Status: DC
  Administered 2017-11-08 – 2017-11-09 (×2): 1 g via ORAL
  Filled 2017-11-07 (×2): qty 1

## 2017-11-07 MED ORDER — ASPIRIN EC 81 MG PO TBEC
81.0000 mg | DELAYED_RELEASE_TABLET | Freq: Every day | ORAL | Status: DC
  Administered 2017-11-08: 81 mg via ORAL
  Filled 2017-11-07 (×2): qty 1

## 2017-11-07 MED ORDER — HEPARIN SODIUM (PORCINE) 5000 UNIT/ML IJ SOLN
5000.0000 [IU] | Freq: Three times a day (TID) | INTRAMUSCULAR | Status: DC
  Administered 2017-11-07 – 2017-11-09 (×4): 5000 [IU] via SUBCUTANEOUS
  Filled 2017-11-07 (×4): qty 1

## 2017-11-07 MED ORDER — VITAMIN E 180 MG (400 UNIT) PO CAPS
400.0000 [IU] | ORAL_CAPSULE | Freq: Every day | ORAL | Status: DC
  Administered 2017-11-08 – 2017-11-09 (×2): 400 [IU] via ORAL
  Filled 2017-11-07 (×2): qty 1

## 2017-11-07 NOTE — ED Notes (Signed)
Pt assisted onto bedpan for urination by this RN and Laury Axon, EDT.

## 2017-11-07 NOTE — ED Notes (Signed)
Dr Archie Balboa aware of pts troponin 0.06. Verbally acknowledged.

## 2017-11-07 NOTE — ED Provider Notes (Signed)
[facility] Emergency Department Provider Note   ____________________________________________   I have reviewed the triage vital signs and the nursing notes.   HISTORY  Chief Complaint Fall; Shortness of Breath; and Altered Mental Status   History limited by: Dementia, history obtained from daughter.   HPI Kelly Mcbride is a 84 y.o. female who presents to the emergency department today at the request of her cardiologist because of concern for fall that occurred last night. The patient does have dementia so cannot tell exactly what happened last night. Daughter states that she has been slightly more confused the past few days. Last night she was put to bed and then shortly afterwards they heard a crash. The daughter thinks that the patient was trying to sit down when she fell. Had a small abrasion to her head.   Per medical record review patient has a history of CHF  Past Medical History:  Diagnosis Date  . 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 Breast cancer ([place]) 01/10/2017  . Cardiomyopathy, secondary ([place]) 06/07/2017  . CHF (congestive heart failure) ([place]) 06/07/2017  . Incomplete emptying of bladder 01/29/2014  . Kidney cyst, acquired 01/10/2017  . Knee pain 04/28/2016  . Low back pain 09/25/2012  . Mixed urge and stress incontinence 09/25/2012  . Nephrolithiasis 01/10/2017    Patient Active Problem List   Diagnosis Date Noted  . Cardiomyopathy, secondary ([place]) 06/07/2017  . CHF (congestive heart failure) ([place]) 06/07/2017  . Calculus of ureter 06/07/2017  . Nephrolithiasis 01/10/2017  . Kidney cyst, acquired 01/10/2017  . 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 Breast cancer ([place]) 01/10/2017  . History of appendectomy 01/10/2017  . Knee pain 04/28/2016  . Incomplete emptying of bladder 01/29/2014  . 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 Low back pain 09/25/2012  . Mixed urge and stress  incontinence 09/25/2012    Past Surgical History:  Procedure Laterality Date  . APPENDECTOMY    . BREAST SURGERY    . EXTRACORPOREAL SHOCK WAVE LITHOTRIPSY Right 07/06/2017   Procedure: EXTRACORPOREAL SHOCK WAVE LITHOTRIPSY (ESWL);  Surgeon: Abbie Sons, MD;  Location: [facility] ORS;  Service: Urology;  Laterality: Right;    Prior to Admission medications   Medication Sig Start Date End Date Taking? Authorizing Provider  acetaminophen (TYLENOL) 325 MG tablet Take by mouth daily.    [provider]  Ascorbic Acid (VITAMIN C) 1000 MG tablet Take 1,000 mg by mouth. 09/25/12   [provider]  bimatoprost (LUMIGAN) 0.01 % SOLN [place] 0.01 % eye drops    [provider]  docusate sodium (COLACE) 100 MG capsule Take 100 mg by mouth 2 (two) times daily.    [provider]  metroNIDAZOLE (METROCREAM) 0.75 % cream  12/31/13   [provider]  Omega-3 Fatty Acids (FISH OIL PO) Take by mouth.    [provider]  omeprazole (PRILOSEC) 20 MG capsule  11/01/13   [provider]  vitamin E (TH VITAMIN E) 1000 UNIT capsule Take by mouth. 09/25/12   [provider]    Allergies Sulfa antibiotics; Aspirin; Ciprofloxacin; Ibuprofen; and Penicillins  No family history on file.  Social History Social History   Tobacco Use  . Smoking status: Never Smoker  . Smokeless tobacco: Never Used  Substance Use Topics  . Alcohol use: No  . Drug use: No    Review of Systems Unable to obtain secondary to dementia.  ____________________________________________   PHYSICAL EXAM:  VITAL SIGNS: ED Triage Vitals  Enc Vitals Group     BP 11/07/17 1702 (!) 144/66     Pulse Rate 11/07/17 1702 (!) 117     Resp 11/07/17 1702 18     Temp 11/07/17 1702 98.7 F (37.1 C)     Temp Source 11/07/17 1702 Oral     SpO2 11/07/17 1702 96 %     Weight 11/07/17 1704 122 lb (55.3 kg)     Height 11/07/17 1704 5\' 3"  (1.6 m)     Head Circumference --       Peak Flow --      Pain Score 11/07/17 1703 7   Constitutional: Awake and alert. Demented.  Eyes: Conjunctivae are normal.  ENT   Head: Normocephalic. Small abrasion to occiput.    Nose: No congestion/rhinnorhea.   Mouth/Throat: Mucous membranes are moist.   Neck: No stridor. Hematological/Lymphatic/Immunilogical: No cervical lymphadenopathy. Cardiovascular: Normal rate, regular rhythm.  No murmurs, rubs, or gallops.  Respiratory: Normal respiratory effort without tachypnea nor retractions. Breath sounds are clear and equal bilaterally. No wheezes/rales/rhonchi. Gastrointestinal: Soft and non tender. No rebound. No guarding.  Genitourinary: Deferred Musculoskeletal: Normal range of motion in all extremities. No lower extremity edema. Neurologic:  Normal speech and language. No gross focal neurologic deficits are appreciated.  Skin:  Skin is warm, dry and intact. No rash noted. Psychiatric: Mood and affect are normal. Speech and behavior are normal. Patient exhibits appropriate insight and judgment.  ____________________________________________    LABS (pertinent positives/negatives)  CMP na 138, k 3.8, glu 118, cr 1.10 CBC wbc 10.4, hgb 14.9, plt 282 Trop 0.06  ____________________________________________   EKG  I, Nance Pear, attending physician, personally viewed and interpreted this EKG  EKG Time: 1654 Rate: 119 Rhythm: sinus tachycardia with pac Axis: right axis deviation Intervals: qtc 452 QRS: narrow, q waves v1 ST changes: no st elevation Impression: abnormal ekg   ____________________________________________    RADIOLOGY  CT head No acute abnormality  CXR Large hiatal hernia, no acute disease ____________________________________________   PROCEDURES  Procedures  ____________________________________________   INITIAL IMPRESSION / ASSESSMENT AND PLAN / ED COURSE  Pertinent labs & imaging results that were available during my  care of the patient were reviewed by me and considered in my medical decision making (see chart for details).  Patient presented to the emergency department today because of concerns for head trauma and fall.  CT head did not show any acute fractures.  Patient does have a small abrasion however nothing that would require advanced closure.  Patient's troponin was minimally elevated at 0.06.  Will plan on admission to the hospital service. Discussed findings with patient and daughter. ____________________________________________   FINAL CLINICAL IMPRESSION(S) / ED DIAGNOSES  Final diagnoses:  Confusion  Fall, initial encounter  Dehydration  Elevated troponin     Note: This dictation was prepared with Dragon dictation. Any transcriptional errors that result from this process are unintentional      Nance Pear, MD 11/08/17 1742

## 2017-11-07 NOTE — ED Notes (Signed)
Admitting at bedside 

## 2017-11-07 NOTE — ED Notes (Signed)
Pt here with daughter Olivia Mackie who states pt fell around 1am in the morning. Hit R posterior head against wooden dresser and then wood floor. Denies blood thinner use. Pt doesn't remember fall.

## 2017-11-07 NOTE — ED Triage Notes (Signed)
Patient presents to the ED after seeing her cardiologist today.  Cardiologist suggested patient come to the ED.  Patient fell last night around 1am and hit her head on a dresser.  Patient has small hematoma and laceration to the back of her head.  Patient is alert and oriented only to self.  Family states patient has been somewhat confused at baseline but is now worse.  Patient has had recent UTI but mainly has been increasingly short of breath since Friday.  Patient has significant pedal edema.  Breath sounds are diminished.

## 2017-11-07 NOTE — H&P (Signed)
[facility] NAME: Kelly Mcbride    MR#:  [id number]  DATE OF BIRTH:  [birth year]  DATE OF ADMISSION:  11/07/2017  PRIMARY CARE PHYSICIAN: Albina Billet, MD   REQUESTING/REFERRING PHYSICIAN:   CHIEF COMPLAINT:   Chief Complaint  Patient presents with  . Fall  . Shortness of Breath  . Altered Mental Status    HISTORY OF PRESENT ILLNESS: Kelly Mcbride  is a 84 y.o. female with a known history of breast cancer, status post surgical removal in the past; CHF, osteoarthritis, dementia. Patient is not able to provide any history due to poor memory, secondary to dementia.  Most of the information was taken from reviewing the medical records and from discussion with urgency room physician and with the family. She was brought to emergency room, status post mechanical fall at home.  Per family, patient fell on her back hitting her head to the floor.  Ms. Kreiter denies any pain to her head and she does not recall any details of her fall.  She denies any chest pain or shortness of breath.  No fever or chills. She has been noted with worsening generalized weakness in the past 2 months, gradually getting worse.  Blood test done emergency room are notable for slightly elevated creatinine level at 1.10 and slightly elevated troponin level is 0.06.  EKG, reviewed by myself, shows sinus tachycardia with heart rate of 119.  There is right axis deviation.  No acute ST-T changes. Chest x-ray and brain CT, reviewed by myself, are noted without any acute changes. Patient is admitted for observation to rule out acute coronary syndrome.  PAST MEDICAL HISTORY:   Past Medical History:  Diagnosis Date  . 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 Breast cancer ([place]) 01/10/2017  . Cardiomyopathy, secondary ([place]) 06/07/2017  . CHF (congestive heart failure) ([place]) 06/07/2017  . Incomplete emptying of bladder  01/29/2014  . Kidney cyst, acquired 01/10/2017  . Knee pain 04/28/2016  . Low back pain 09/25/2012  . Mixed urge and stress incontinence 09/25/2012  . Nephrolithiasis 01/10/2017    PAST SURGICAL HISTORY:  Past Surgical History:  Procedure Laterality Date  . APPENDECTOMY    . BREAST SURGERY    . EXTRACORPOREAL SHOCK WAVE LITHOTRIPSY Right 07/06/2017   Procedure: EXTRACORPOREAL SHOCK WAVE LITHOTRIPSY (ESWL);  Surgeon: Abbie Sons, MD;  Location: [facility] ORS;  Service: Urology;  Laterality: Right;    SOCIAL HISTORY:  Social History   Tobacco Use  . Smoking status: Never Smoker  . Smokeless tobacco: Never Used  Substance Use Topics  . Alcohol use: No    FAMILY HISTORY: No family history on file.  DRUG ALLERGIES:  Allergies  Allergen Reactions  . Sulfa Antibiotics Hives and Rash  . Aspirin   . Ciprofloxacin     Other reaction(s): Other (See Comments) Per patient she get upset stomach and dizziness.   . Ibuprofen     Other reaction(s): Other (See Comments) Because of hiatal hernia  Other reaction(s): Other (See Comments) Because of hiatal hernia    . Penicillins Other (See Comments)    Has patient had a PCN reaction causing immediate rash, facial/tongue/throat swelling, SOB or lightheadedness with hypotension: Unknown Has patient had a PCN reaction causing severe rash involving mucus membranes or skin necrosis: Unknown Has patient had a PCN reaction that required hospitalization: Unknown Has patient had a PCN reaction occurring within  the last 10 years: Unknown If all of the above answers are "NO", then may proceed with Cephalosporin use.     REVIEW OF SYSTEMS:   Unable to obtain, due to patient being confused from dementia.  MEDICATIONS AT HOME:  Prior to Admission medications   Medication Sig Start Date End Date Taking? Authorizing Provider  acetaminophen (TYLENOL) 500 MG tablet Take 1,000 mg by mouth daily as needed for mild pain.    Yes [provider]   Ascorbic Acid (VITAMIN C) 1000 MG tablet Take 1,000 mg by mouth. 09/25/12  Yes [provider]  bimatoprost (LUMIGAN) 0.01 % SOLN Instill 1 drop in both eyes at bedtime.   Yes [provider]  docusate sodium (COLACE) 100 MG capsule Take 100 mg by mouth 2 (two) times daily as needed for mild constipation.    Yes [provider]  dorzolamide (TRUSOPT) 2 % ophthalmic solution Place 1 drop into the right eye 2 (two) times daily.   Yes [provider]  Omega-3 Fatty Acids (FISH OIL PO) Take 1 capsule by mouth daily.    Yes [provider]  omeprazole (PRILOSEC) 20 MG capsule Take 20 mg by mouth daily.  11/01/13  Yes [provider]  vitamin E (TH VITAMIN E) 400 UNIT capsule Take 400 Units by mouth daily.  09/25/12  Yes [provider]      PHYSICAL EXAMINATION:   VITAL SIGNS: Blood pressure (!) 178/91, pulse 100, temperature 98.2 F (36.8 C), temperature source Oral, resp. rate 17, height 5\' 3"  (1.6 m), weight 54.3 kg (119 lb 12.8 oz), SpO2 98 %.  GENERAL:  84 y.o.-year-old patient lying in the bed with no acute distress.  EYES: Pupils equal, round, reactive to light and accommodation. No scleral icterus. HEENT: Head atraumatic, normocephalic. Oropharynx and nasopharynx clear.  NECK:  Supple, no jugular venous distention. No thyroid enlargement, no tenderness.  LUNGS: Reduced breath sounds bilaterally, no wheezing, rales,rhonchi or crepitation. No use of accessory muscles of respiration.  CARDIOVASCULAR: S1, S2 normal. No S3/S4.  ABDOMEN: Soft, nontender, nondistended. Bowel sounds present. No organomegaly or mass.  EXTREMITIES: No pedal edema, cyanosis, or clubbing.  NEUROLOGIC: No focal weakness.  PSYCHIATRIC: The patient is alert, but disoriented.  SKIN: No obvious rash, lesion, or ulcer.   LABORATORY PANEL:   CBC Recent Labs  Lab 11/07/17 1721  WBC 10.4  HGB 14.9  HCT 44.1  PLT 282  MCV 87.0  MCH 29.4  MCHC 33.7  RDW  13.6   ------------------------------------------------------------------------------------------------------------------  Chemistries  Recent Labs  Lab 11/07/17 1721  NA 138  K 3.8  CL 105  CO2 26  GLUCOSE 118*  BUN 20  CREATININE 1.10*  CALCIUM 10.4*  AST 22  ALT 24  ALKPHOS 112  BILITOT 0.8   ------------------------------------------------------------------------------------------------------------------ estimated creatinine clearance is 27 mL/min (A) (by C-G formula based on SCr of 1.1 mg/dL (H)). ------------------------------------------------------------------------------------------------------------------ No results for input(s): TSH, T4TOTAL, T3FREE, THYROIDAB in the last 72 hours.  Invalid input(s): FREET3   Coagulation profile No results for input(s): INR, PROTIME in the last 168 hours. ------------------------------------------------------------------------------------------------------------------- No results for input(s): DDIMER in the last 72 hours. -------------------------------------------------------------------------------------------------------------------  Cardiac Enzymes Recent Labs  Lab 11/07/17 1721  TROPONINI 0.06*   ------------------------------------------------------------------------------------------------------------------ Invalid input(s): POCBNP  ---------------------------------------------------------------------------------------------------------------  Urinalysis    Component Value Date/Time   APPEARANCEUR Cloudy (A) 06/27/2017 1353   GLUCOSEU Negative 06/27/2017 1353   BILIRUBINUR Negative 06/27/2017 1353   PROTEINUR Negative 06/27/2017 1353   NITRITE Negative  06/27/2017 1353   LEUKOCYTESUR 2+ (A) 06/27/2017 1353     RADIOLOGY: Dg Chest 2 View  Result Date: 11/07/2017 CLINICAL DATA:  84 y/o  F; fall with head injury. EXAM: CHEST - 2 VIEW COMPARISON:  09/18/2012 chest radiograph. FINDINGS: Stable cardiac  silhouette given projection and technique. Giant hiatal hernia. Lung bases are obscured. Pulmonary venous hypertension. Consolidation in the mid and upper lung zones. Surgical clips in left axilla. Calcific atherosclerosis of the aorta. No acute osseous abnormality is evident. IMPRESSION: Giant hiatal hernia obscuring the lung bases. Pulmonary venous hypertension. Aortic atherosclerosis. Electronically Signed   By: Kristine Garbe M.D.   On: 11/07/2017 17:59   Ct Head Wo Contrast  Result Date: 11/07/2017 CLINICAL DATA:  [place] and hit head on dresser last night. Laceration and hematoma. Increased confusion over baseline. Initial encounter. EXAM: CT HEAD WITHOUT CONTRAST TECHNIQUE: Contiguous axial images were obtained from the base of the skull through the vertex without intravenous contrast. COMPARISON:  11/27/2005 FINDINGS: Brain: There is no evidence of acute infarct, intracranial hemorrhage, mass, midline shift, or extra-axial fluid collection. Moderate cerebral atrophy has progressed. Patchy to confluent cerebral white matter hypodensities have also progressed and are nonspecific but compatible with moderate chronic small vessel ischemic disease. Vascular: Calcified atherosclerosis at the skull base. No hyperdense vessel. Skull: No fracture or suspicious osseous lesion. Sinuses/Orbits: Visualized paranasal sinuses and mastoid air cells are clear. Bilateral cataract extraction is noted. Other: None. IMPRESSION: 1. No evidence of acute intracranial abnormality. 2. Progressive chronic small vessel ischemic disease and cerebral atrophy. Electronically Signed   By: Logan Bores M.D.   On: 11/07/2017 18:14    EKG: Orders placed or performed during the hospital encounter of 11/07/17  . EKG 12-Lead  . EKG 12-Lead  . ED EKG within 10 minutes  . ED EKG within 10 minutes    IMPRESSION AND PLAN:  1. NSTEMI.  Initial troponin level is slightly elevated at 0.06, likely related to demand ischemia,  related to fall, sinus tachycardia and dehydration.  We will continue to monitor patient on telemetry and follow the troponin levels.  We will check 2D echo.  Cardiology is consulted for further evaluation and treatment.  Per family, they would like to avoid invasive procedures. 2.  Acute renal failure, likely prerenal secondary to poor p.o. Intake, due to dementia.  We will start gentle IV hydration.  Family was advised to help patient increase p.o. fluid intake, at home.  We will monitor kidney function closely and avoid nephrotoxic medications. 3.  Sinus tachycardia, likely secondary to dehydration.  Start gentle IV hydration and continue to monitor patient closely.  4.  CHF, currently clinically compensated.  Continue medical treatment. 5.  Advance dementia, poor long-term prognosis.  Continue supportive measures and monitor clinically closely. 6.  Generalized weakness and unstable gait.  Will consult PT/OT for further evaluation and treatment.  All the records are reviewed and case discussed with ED provider. Management plans discussed with the patient, family and they are in agreement.  CODE STATUS:    Code Status Orders  (From admission, onward)        Start     Ordered   11/07/17 2232  Full code  Continuous     11/07/17 2231    Code Status History    This patient has a current code status but no historical code status.    Advance Directive Documentation     Most Recent Value  Type of Advance Directive  Healthcare Power  of Attorney, Living will  Pre-existing out of facility DNR order (yellow form or pink MOST form)  -  "MOST" Form in Place?  -       TOTAL TIME TAKING CARE OF THIS PATIENT: 45 minutes.    Amelia Jo M.D on 11/07/2017 at 11:28 PM  Between 7am to 6pm - Pager - [phone]  After 6pm go to [email or web address] - password [email or web address] [facility]  [facility]  Office  [phone]  CC: Primary care physician; Albina Billet, MD

## 2017-11-08 ENCOUNTER — Observation Stay
Admit: 2017-11-08 | Discharge: 2017-11-08 | Disposition: A | Discharge status: Home / Self Care | Payer: Medicare Other | Attending: Internal Medicine | Admitting: Internal Medicine

## 2017-11-08 DIAGNOSIS — E440 Moderate protein-calorie malnutrition: Secondary | ICD-10-CM

## 2017-11-08 LAB — BASIC METABOLIC PANEL
Anion gap: 9 (ref 5–15)
BUN: 18 mg/dL (ref 6–20)
CALCIUM: 9.9 mg/dL (ref 8.9–10.3)
CHLORIDE: 110 mmol/L (ref 101–111)
CO2: 22 mmol/L (ref 22–32)
CREATININE: 1.15 mg/dL — AB (ref 0.44–1.00)
GFR calc Af Amer: 46 mL/min — ABNORMAL LOW (ref >60)
GFR calc non Af Amer: 40 mL/min — ABNORMAL LOW (ref >60)
GLUCOSE: 94 mg/dL (ref 65–99)
Potassium: 3.9 mmol/L (ref 3.5–5.1)
Sodium: 141 mmol/L (ref 135–145)

## 2017-11-08 LAB — URINALYSIS, COMPLETE (UACMP) WITH MICROSCOPIC
BACTERIA UA: NONE SEEN
BILIRUBIN URINE: NEGATIVE
Glucose, UA: NEGATIVE mg/dL
Hgb urine dipstick: NEGATIVE
KETONES UR: NEGATIVE mg/dL
NITRITE: NEGATIVE
Protein, ur: NEGATIVE mg/dL
Specific Gravity, Urine: 1.01 (ref 1.005–1.030)
pH: 6 (ref 5.0–8.0)

## 2017-11-08 LAB — GLUCOSE, CAPILLARY: GLUCOSE-CAPILLARY: 73 mg/dL (ref 65–99)

## 2017-11-08 LAB — TROPONIN I: Troponin I: <0.03 ng/mL (ref <0.03)

## 2017-11-08 MED ORDER — ENSURE ENLIVE PO LIQD
237.0000 mL | Freq: Two times a day (BID) | ORAL | Status: DC
  Administered 2017-11-08: 237 mL via ORAL

## 2017-11-08 MED ORDER — VITAMIN C 500 MG PO TABS
250.0000 mg | ORAL_TABLET | Freq: Two times a day (BID) | ORAL | Status: DC
  Administered 2017-11-08: 250 mg via ORAL
  Filled 2017-11-08 (×3): qty 0.5

## 2017-11-08 MED ORDER — SODIUM CHLORIDE 0.9 % IV SOLN
1.0000 g | INTRAVENOUS | Status: DC
  Administered 2017-11-08: 1 g via INTRAVENOUS
  Filled 2017-11-08 (×2): qty 10

## 2017-11-08 MED ORDER — ADULT MULTIVITAMIN W/MINERALS CH
1.0000 | ORAL_TABLET | Freq: Every day | ORAL | Status: DC
  Administered 2017-11-09: 1 via ORAL
  Filled 2017-11-08 (×3): qty 1

## 2017-11-08 MED ORDER — FAMOTIDINE 20 MG PO TABS
20.0000 mg | ORAL_TABLET | Freq: Two times a day (BID) | ORAL | Status: DC
  Administered 2017-11-08 – 2017-11-09 (×2): 20 mg via ORAL
  Filled 2017-11-08 (×2): qty 1

## 2017-11-08 NOTE — Progress Notes (Addendum)
Patient ID: [name], female   DOB: [birth year], 84 y.o.   MRN: [id number]  [facility] PROGRESS NOTE  Kelly Mcbride FFM:[id number] DOB: 23-Jan-1923 DOA: 11/07/2017 PCP: Albina Billet, MD  HPI/Subjective: Patient had a fall.  Complained initially of some chest pain or shortness of breath but when I spoke with her today she complained of back pain and breaking her back.  The patient does have a history of dementia so history is difficult.  Objective: Vitals:   11/08/17 0355 11/08/17 0800  BP: (!) 166/91 137/75  Pulse: 97 91  Resp: 18 18  Temp: 98.1 F (36.7 C) 97.7 F (36.5 C)  SpO2: 96% 99%    Filed Weights   11/07/17 1704 11/07/17 2300 11/08/17 0355  Weight: 55.3 kg (122 lb) 54.3 kg (119 lb 12.8 oz) 54.4 kg (120 lb)    ROS: Review of Systems  Unable to perform ROS: Dementia  Respiratory: Positive for shortness of breath.   Cardiovascular: Negative for chest pain.  Gastrointestinal: Negative for abdominal pain.  Musculoskeletal: Positive for back pain.   Exam: Physical Exam  HENT:  Nose: No mucosal edema.  Mouth/Throat: No oropharyngeal exudate or posterior oropharyngeal edema.  Eyes: Pupils are equal, round, and reactive to light. Conjunctivae, EOM and lids are normal.  Neck: No JVD present. Carotid bruit is not present. No edema present. No thyroid mass and no thyromegaly present.  Cardiovascular: S1 normal and S2 normal. Exam reveals no gallop.  No murmur heard. Pulses:      Dorsalis pedis pulses are 2+ on the right side, and 2+ on the left side.  Respiratory: No respiratory distress. She has decreased breath sounds in the right lower field and the left lower field. She has no wheezes. She has no rhonchi. She has no rales.  GI: Soft. Bowel sounds are normal. There is no tenderness.  Musculoskeletal:       Right ankle: She exhibits no swelling.       Left ankle: She exhibits no swelling.  Lymphadenopathy:    She has no cervical adenopathy.  Neurological:  She is alert. No cranial nerve deficit.  Moves all extremities to command.  Skin: Skin is warm. No rash noted. Nails show no clubbing.  Psychiatric: She has a normal mood and affect.      Data Reviewed: Basic Metabolic Panel: Recent Labs  Lab 11/07/17 1721 11/08/17 0425  NA 138 141  K 3.8 3.9  CL 105 110  CO2 26 22  GLUCOSE 118* 94  BUN 20 18  CREATININE 1.10* 1.15*  CALCIUM 10.4* 9.9   Liver Function Tests: Recent Labs  Lab 11/07/17 1721  AST 22  ALT 24  ALKPHOS 112  BILITOT 0.8  PROT 7.1  ALBUMIN 3.2*   CBC: Recent Labs  Lab 11/07/17 1721  WBC 10.4  HGB 14.9  HCT 44.1  MCV 87.0  PLT 282   Cardiac Enzymes: Recent Labs  Lab 11/07/17 1721 11/08/17 0425  TROPONINI 0.06* <0.03    CBG: Recent Labs  Lab 11/08/17 0734  GLUCAP 73     Studies: Dg Chest 2 View  Result Date: 11/07/2017 CLINICAL DATA:  84 y/o  F; fall with head injury. EXAM: CHEST - 2 VIEW COMPARISON:  09/18/2012 chest radiograph. FINDINGS: Stable cardiac silhouette given projection and technique. Giant hiatal hernia. Lung bases are obscured. Pulmonary venous hypertension. Consolidation in the mid and upper lung zones. Surgical clips in left axilla. Calcific atherosclerosis of the aorta. No acute osseous abnormality  is evident. IMPRESSION: Giant hiatal hernia obscuring the lung bases. Pulmonary venous hypertension. Aortic atherosclerosis. Electronically Signed   By: Kristine Garbe M.D.   On: 11/07/2017 17:59   Ct Head Wo Contrast  Result Date: 11/07/2017 CLINICAL DATA:  [place] and hit head on dresser last night. Laceration and hematoma. Increased confusion over baseline. Initial encounter. EXAM: CT HEAD WITHOUT CONTRAST TECHNIQUE: Contiguous axial images were obtained from the base of the skull through the vertex without intravenous contrast. COMPARISON:  11/27/2005 FINDINGS: Brain: There is no evidence of acute infarct, intracranial hemorrhage, mass, midline shift, or extra-axial fluid  collection. Moderate cerebral atrophy has progressed. Patchy to confluent cerebral white matter hypodensities have also progressed and are nonspecific but compatible with moderate chronic small vessel ischemic disease. Vascular: Calcified atherosclerosis at the skull base. No hyperdense vessel. Skull: No fracture or suspicious osseous lesion. Sinuses/Orbits: Visualized paranasal sinuses and mastoid air cells are clear. Bilateral cataract extraction is noted. Other: None. IMPRESSION: 1. No evidence of acute intracranial abnormality. 2. Progressive chronic small vessel ischemic disease and cerebral atrophy. Electronically Signed   By: Logan Bores M.D.   On: 11/07/2017 18:14    Scheduled Meds: . aspirin EC  81 mg Oral Daily  . docusate sodium  100 mg Oral BID  . dorzolamide  1 drop Right Eye BID  . feeding supplement (ENSURE ENLIVE)  237 mL Oral BID BM  . heparin  5,000 Units Subcutaneous Q8H  . latanoprost  1 drop Both Eyes QHS  . multivitamin with minerals  1 tablet Oral Daily  . omega-3 acid ethyl esters  1 g Oral Daily  . pantoprazole  40 mg Oral Daily  . vitamin C  250 mg Oral BID  . vitamin E  400 Units Oral Daily   Continuous Infusions: . cefTRIAXone (ROCEPHIN)  IV 1 g (11/08/17 1440)    Assessment/Plan:  1. Acute cystitis with out hematuria.  Start Rocephin and send off urine culture. 2. Weakness and falls.  Physical therapy evaluation.  Try to set up a hospital bed at home. 3. This is not a myocardial infarction.  Family refused further cardiac enzymes or any other testing. 4. Large hiatal hernia will put on H2 blocker. 5. Dementia without behavioral disturbance. 6. Glaucoma on eyedrops 7. Moderate malnutrition  Code Status:     Code Status Orders  (From admission, onward)        Start     Ordered   11/08/17 1017  Do not attempt resuscitation (DNR)  Continuous    Question Answer Comment  In the event of cardiac or respiratory ARREST Do not call a "code blue"   In the  event of cardiac or respiratory ARREST Do not perform Intubation, CPR, defibrillation or ACLS   In the event of cardiac or respiratory ARREST Use medication by any route, position, wound care, and other measures to relive pain and suffering. May use oxygen, suction and manual treatment of airway obstruction as needed for comfort.   Comments nurse may pronounce      11/08/17 1016    Code Status History    Date Active Date Inactive Code Status Order ID Comments User Context   11/07/2017 2231 11/08/2017 1016 Full Code [id number]  Amelia Jo, MD ED    Advance Directive Documentation     Most Recent Value  Type of Advance Directive  Healthcare Power of Attorney, Living will  Pre-existing out of facility DNR order (yellow form or pink MOST form)  -  "MOST"  Form in Place?  -     Family Communication:  daughter at bedside  Disposition Plan: Potentially home tomorrow  Antibiotics:  Rocephin  Time spent: 35 minutes including ACP time  [employer]

## 2017-11-08 NOTE — Progress Notes (Signed)
Patient ID: Kelly Mcbride, female   DOB: 1922/12/14, 84 y.o.   MRN: [id number]   ACP note  Patient and daughter at the bedside  Diagnosis: Acute cystitis without hematuria, moderate malnutrition, weakness and falls, large hiatal hernia, dementia without behavioral disturbance   Plan.  Physical therapy consultation.  Set up a hospital bed.  Potentially home health at home.  CODE STATUS discussed.  Patient made a DNR.  Explained dementia is a progressive disease.   Time spent on ACP discussion 17 minutes  Dr. Loletha Grayer

## 2017-11-08 NOTE — Care Management (Addendum)
Patient admitted with chest pain.  Patient with history of dementia.  Assessment obtained from daughter.  Patient lives in the home with her daughter.  PCS services 5 days a week, 5.5 hours a day.  PCP Hall Busing.  Patient has RW and BSC in the home.  Patient has never had home health services.  PT consult pending RNCM following for discharge planning.  Daughter requesting hospital bed at discharge.

## 2017-11-08 NOTE — Consult Note (Signed)
Cardiology Consultation Note    Patient ID: Kelly Mcbride, MRN: [id number], DOB/AGE: 03-05-1923 84 y.o. Admit date: 11/07/2017   Date of Consult: 11/08/2017 Primary Physician: Albina Billet, MD Primary Cardiologist: Dr. Clayborn Bigness  Chief Complaint: chest pain Reason for Consultation: abnormal troponin Requesting MD: Dr. Leslye Peer  HPI: Kelly Mcbride is a 84 y.o. female with history of dementia, chf, lower extremity edema who presented to the er with mechanical falls and apparent chest pain although the patient does not recal if she had pain or not. Her initial troponin was 0.06 with subsequent troponin of 0.03. EKG showed no ischemia. She has ruled out for an mi. She is hemodynamically stable. Per chart, patient family not interested in invasive workup.   Past Medical History:  Diagnosis Date  . 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 Breast cancer ([place]) 01/10/2017  . Cardiomyopathy, secondary ([place]) 06/07/2017  . CHF (congestive heart failure) ([place]) 06/07/2017  . Incomplete emptying of bladder 01/29/2014  . Kidney cyst, acquired 01/10/2017  . Knee pain 04/28/2016  . Low back pain 09/25/2012  . Mixed urge and stress incontinence 09/25/2012  . Nephrolithiasis 01/10/2017      Surgical History:  Past Surgical History:  Procedure Laterality Date  . APPENDECTOMY    . BREAST SURGERY    . EXTRACORPOREAL SHOCK WAVE LITHOTRIPSY Right 07/06/2017   Procedure: EXTRACORPOREAL SHOCK WAVE LITHOTRIPSY (ESWL);  Surgeon: Abbie Sons, MD;  Location: [facility] ORS;  Service: Urology;  Laterality: Right;     Home Meds: Prior to Admission medications   Medication Sig Start Date End Date Taking? Authorizing Provider  acetaminophen (TYLENOL) 500 MG tablet Take 1,000 mg by mouth daily as needed for mild pain.    Yes [provider]  Ascorbic Acid (VITAMIN C) 1000 MG tablet Take 1,000 mg by mouth. 09/25/12  Yes [provider]  bimatoprost  (LUMIGAN) 0.01 % SOLN Instill 1 drop in both eyes at bedtime.   Yes [provider]  docusate sodium (COLACE) 100 MG capsule Take 100 mg by mouth 2 (two) times daily as needed for mild constipation.    Yes [provider]  dorzolamide (TRUSOPT) 2 % ophthalmic solution Place 1 drop into the right eye 2 (two) times daily.   Yes [provider]  Omega-3 Fatty Acids (FISH OIL PO) Take 1 capsule by mouth daily.    Yes [provider]  omeprazole (PRILOSEC) 20 MG capsule Take 20 mg by mouth daily.  11/01/13  Yes [provider]  vitamin E (TH VITAMIN E) 400 UNIT capsule Take 400 Units by mouth daily.  09/25/12  Yes [provider]    Inpatient Medications:  . aspirin EC  81 mg Oral Daily  . docusate sodium  100 mg Oral BID  . dorzolamide  1 drop Right Eye BID  . heparin  5,000 Units Subcutaneous Q8H  . latanoprost  1 drop Both Eyes QHS  . omega-3 acid ethyl esters  1 g Oral Daily  . pantoprazole  40 mg Oral Daily  . vitamin C  1,000 mg Oral Daily  . vitamin E  400 Units Oral Daily     Allergies:  Allergies  Allergen Reactions  . Sulfa Antibiotics Hives and Rash  . Aspirin   . Ciprofloxacin     Other reaction(s): Other (See Comments) Per patient she get upset stomach and dizziness.   . Ibuprofen     Other  reaction(s): Other (See Comments) Because of hiatal hernia  Other reaction(s): Other (See Comments) Because of hiatal hernia    . Penicillins Other (See Comments)    Has patient had a PCN reaction causing immediate rash, facial/tongue/throat swelling, SOB or lightheadedness with hypotension: Unknown Has patient had a PCN reaction causing severe rash involving mucus membranes or skin necrosis: Unknown Has patient had a PCN reaction that required hospitalization: Unknown Has patient had a PCN reaction occurring within the last 10 years: Unknown If all of the above answers are "NO", then may proceed with Cephalosporin use.      Social History   Socioeconomic History  . Marital status: Widowed    Spouse name: Not on file  . Number of children: Not on file  . Years of education: Not on file  . Highest education level: Not on file  Occupational History  . Not on file  Social Needs  . Financial resource strain: Not on file  . Food insecurity:    Worry: Not on file    Inability: Not on file  . Transportation needs:    Medical: Not on file    Non-medical: Not on file  Tobacco Use  . Smoking status: Never Smoker  . Smokeless tobacco: Never Used  Substance and Sexual Activity  . Alcohol use: No  . Drug use: No  . Sexual activity: Never  Lifestyle  . Physical activity:    Days per week: Not on file    Minutes per session: Not on file  . Stress: Not on file  Relationships  . Social connections:    Talks on phone: Not on file    Gets together: Not on file    Attends religious service: Not on file    Active member of club or organization: Not on file    Attends meetings of clubs or organizations: Not on file    Relationship status: Not on file  . Intimate partner violence:    Fear of current or ex partner: Not on file    Emotionally abused: Not on file    Physically abused: Not on file    Forced sexual activity: Not on file  Other Topics Concern  . Not on file  Social History Narrative  . Not on file     No family history on file.   Review of Systems: A 12-system review of systems was performed and is negative except as noted in the HPI.  Labs: Recent Labs    11/07/17 1721 11/08/17 0425  TROPONINI 0.06* <0.03   Lab Results  Component Value Date   WBC 10.4 11/07/2017   HGB 14.9 11/07/2017   HCT 44.1 11/07/2017   MCV 87.0 11/07/2017   PLT 282 11/07/2017    Recent Labs  Lab 11/07/17 1721 11/08/17 0425  NA 138 141  K 3.8 3.9  CL 105 110  CO2 26 22  BUN 20 18  CREATININE 1.10* 1.15*  CALCIUM 10.4* 9.9  PROT 7.1  --   BILITOT 0.8  --   ALKPHOS 112  --   ALT 24  --    AST 22  --   GLUCOSE 118* 94   No results found for: CHOL, HDL, LDLCALC, TRIG No results found for: DDIMER  Radiology/Studies:  Dg Chest 2 View  Result Date: 11/07/2017 CLINICAL DATA:  84 y/o  F; fall with head injury. EXAM: CHEST - 2 VIEW COMPARISON:  09/18/2012 chest radiograph. FINDINGS: Stable cardiac silhouette given projection and technique. Giant hiatal  hernia. Lung bases are obscured. Pulmonary venous hypertension. Consolidation in the mid and upper lung zones. Surgical clips in left axilla. Calcific atherosclerosis of the aorta. No acute osseous abnormality is evident. IMPRESSION: Giant hiatal hernia obscuring the lung bases. Pulmonary venous hypertension. Aortic atherosclerosis. Electronically Signed   By: Kristine Garbe M.D.   On: 11/07/2017 17:59   Abdomen 1 View (kub)  Result Date: 10/27/2017 CLINICAL DATA:  Left-sided pain and dysuria for 2 months. Kidney stones. EXAM: ABDOMEN - 1 VIEW COMPARISON:  07/06/2017 FINDINGS: Two supine views. Nonobstructive bowel-gas pattern. Moderate amount of stool in the rectum and sigmoid. Calcific densities projecting over both kidneys. The largest is over the lower pole right kidney at 1.8 cm, similar. No calcific densities over the expected course of the abdominal ureters. Probable phleboliths in the pelvis. Osteopenia. IMPRESSION: Bilateral nephrolithiasis, similar. Electronically Signed   By: Abigail Miyamoto M.D.   On: 10/27/2017 08:25   Ct Head Wo Contrast  Result Date: 11/07/2017 CLINICAL DATA:  [place] [address] and hit head on dresser last night. Laceration and hematoma. Increased confusion over baseline. Initial encounter. EXAM: CT HEAD WITHOUT CONTRAST TECHNIQUE: Contiguous axial images were obtained from the base of the skull through the vertex without intravenous contrast. COMPARISON:  11/27/2005 FINDINGS: Brain: There is no evidence of acute infarct, intracranial hemorrhage, mass, midline shift, or extra-axial fluid collection. Moderate cerebral  atrophy has progressed. Patchy to confluent cerebral white matter hypodensities have also progressed and are nonspecific but compatible with moderate chronic small vessel ischemic disease. Vascular: Calcified atherosclerosis at the skull base. No hyperdense vessel. Skull: No fracture or suspicious osseous lesion. Sinuses/Orbits: Visualized paranasal sinuses and mastoid air cells are clear. Bilateral cataract extraction is noted. Other: None. IMPRESSION: 1. No evidence of acute intracranial abnormality. 2. Progressive chronic small vessel ischemic disease and cerebral atrophy. Electronically Signed   By: Logan Bores M.D.   On: 11/07/2017 18:14    Wt Readings from Last 3 Encounters:  11/08/17 54.4 kg (120 lb)  07/06/17 52.2 kg (115 lb)  06/07/17 52.2 kg (115 lb)    EKG: sinus tachycardia  Physical Exam:  Blood pressure 137/75, pulse 91, temperature 97.7 F (36.5 C), temperature source Oral, resp. rate 18, height 5\' 3"  (1.6 m), weight 54.4 kg (120 lb), SpO2 99 %. Body mass index is 21.26 kg/m. General: Well developed, well nourished, in no acute distress. Head: Normocephalic, atraumatic, sclera non-icteric, no xanthomas, nares are without discharge.  Neck: Negative for carotid bruits. JVD not elevated. Lungs: Clear bilaterally to auscultation without wheezes, rales, or rhonchi. Breathing is unlabored. Heart: RRR with S1 S2. No murmurs, rubs, or gallops appreciated. Abdomen: Soft, non-tender, non-distended with normoactive bowel sounds. No hepatomegaly. No rebound/guarding. No obvious abdominal masses. Msk:  Strength and tone appear normal for age. Extremities: No clubbing or cyanosis. No edema.  Distal pedal pulses are 2+ and equal bilaterally. Neuro: Alert but not oriented to place or time.      Assessment and Plan  84 yo female with dementia admitted with weakness and apparent chest pain with mechanical falls. EKG unremarkable. CXR unremarkable. Pt does not recall if she had chest pain.  She appears to have ruled out for an mi. Not a candidtate for invasive evaluation. Will review echo when completed. Continue with current meds.   Signed, Teodoro Spray MD 11/08/2017, 10:55 AM Pager: [phone]

## 2017-11-08 NOTE — Progress Notes (Signed)
Initial Nutrition Assessment  DOCUMENTATION CODES:   Non-severe (moderate) malnutrition in context of chronic illness  INTERVENTION:   Ensure Enlive po BID, each supplement provides 350 kcal and 20 grams of protein  Magic cup TID with meals, each supplement provides 290 kcal and 9 grams of protein  MVI daily  Dysphagia 3 diet  NUTRITION DIAGNOSIS:   Moderate Malnutrition related to chronic illness(breast cancer, CHF, dementia ) as evidenced by mild fat depletion, moderate muscle depletion.  GOAL:   Patient will meet greater than or equal to 90% of their needs  MONITOR:   PO intake, Supplement acceptance, Labs, Weight trends, Skin, I & O's  REASON FOR ASSESSMENT:   Malnutrition Screening Tool    ASSESSMENT:   84 y.o. female with a known history of breast cancer, status post surgical removal in the past; CHF, osteoarthritis, dementia.   Met with pt and pt's daughter in room today. Pt with h/o dementia so history obtained from pt's daughter at bedside. Per daughter, pt with a slow decline in her appetite and oral intake over the past few months. Pt was independent and living alone until November when she moved in with her daughter. Pt weighed around 112lbs when she initially moved in but has gained 8-10lbs since. Pt is drinking vanilla Ensure at home, but not every day. Pt also takes vitamin E, fish oil, and vitamin C daily. Pt requires chopped meats and daughter reports pt has trouble swallowing meats and bread. RD discussed with pt the importance of adequate protein intake needed to preserve lean muscle. Recommend daily Ensure and multivitamin. RD will order supplements and change pt to a dysphagia 3 diet. Pt also noted to have hiatal hernia, on chronic PPIs.   Medications reviewed and include: aspirin, colace, heparin, omega 3, protonix, vit C, vit E  Labs reviewed:   NUTRITION - FOCUSED PHYSICAL EXAM:    Most Recent Value  Orbital Region  Mild depletion  Upper Arm  Region  No depletion  Thoracic and Lumbar Region  Mild depletion  Buccal Region  Mild depletion  Temple Region  Mild depletion  Clavicle Bone Region  Moderate depletion  Clavicle and Acromion Bone Region  Moderate depletion  Scapular Bone Region  Mild depletion  Dorsal Hand  Moderate depletion  Patellar Region  Mild depletion  Anterior Thigh Region  Mild depletion  Posterior Calf Region  Mild depletion  Edema (RD Assessment)  Moderate  Hair  Reviewed  Eyes  Reviewed  Mouth  Reviewed  Skin  Reviewed- pt with bruising on bilateral arms  Nails  Reviewed     Diet Order:  Diet 2 gram sodium Room service appropriate? Yes; Fluid consistency: Thin  EDUCATION NEEDS:   Education needs have been addressed(with pt's daughter)  Skin:  Skin Assessment: Reviewed RN Assessment  Last BM:  pta  Height:   Ht Readings from Last 1 Encounters:  11/07/17 _0  (1.6 m)    Weight:   Wt Readings from Last 1 Encounters:  11/08/17 120 lb (54.4 kg)    Ideal Body Weight:  52.3 kg  BMI:  Body mass index is 21.26 kg/m.  Estimated Nutritional Needs:   Kcal:  1200-1400kcal/day   Protein:  71-82g/day   Fluid:  >1.4L/day   Koleen Distance MS, RD, LDN Pager #[phone] After Hours Pager: [phone]

## 2017-11-08 NOTE — Care Management (Signed)
Patientsuffersfromchronic low back pain whichiscausedbyOsteoporosis. Hospital bedwillalleviatepainbyallowingback tobepositionedinwaysnotfeasiblewithanormalbed.Painepisodesfrequently require changesinbodypositionwhichcannotbeachievedwithanormalbe achieved in a normal bed.

## 2017-11-08 NOTE — Plan of Care (Signed)
  Problem: Clinical Measurements: Goal: Diagnostic test results will improve Outcome: Progressing   

## 2017-11-08 NOTE — Care Management Obs Status (Signed)
[place] NOTIFICATION   Patient Details  Name: Kelly Mcbride MRN: [id number] Date of Birth: Mar 24, 1923   Medicare Observation Status Notification Given:  Yes    Beverly Sessions, RN 11/08/2017, 1:43 PM

## 2017-11-08 NOTE — Evaluation (Signed)
Physical Therapy Evaluation Patient Details Name: Kelly Mcbride MRN: [id number] DOB: 12-24-1922 Today's Date: 11/08/2017   History of Present Illness  Pt is a 84 y.o. female presenting to hospital s/p fall hitting her head on dresser (small hematoma and laceration back of head); increased confusion from baseline.  Pt admitted under observation to r/o acute coronary syndrome, acute renal failure, and sinus tachycardia.  PMH includes breast CA, CHF, OA, dementia, giant hiatal hernia obscuring lung bases.  Clinical Impression  Prior to hospital admission, pt was ambulatory with 4ww (family tries to provide 24/7 assist and has home care services so pt always has someone present when getting up and ambulating d/t h/o unsteadiness and concerns for falling).  Pt lives with her daughter and son-in-law in 1 level home with 3 steps to enter with R grab bar.  Currently pt is mod assist supine to sit; CGA to stand from bed with vc's; and CGA ambulating up to 60 feet with RW (limited distance ambulating d/t HR elevation from 93 bpm at rest to 133 bpm with ambulation; nursing notified; O2 sats 93% or greater on room air during session).  Pt reports back pain (mostly with bed mobility and standing) but did not rate during session (nursing aware).  Pt would benefit from skilled PT to address noted impairments and functional limitations (see below for any additional details).  Upon hospital discharge, recommend pt discharge to home with 24/7 assist for functional mobility and HHPT.    Follow Up Recommendations Home health PT;Supervision/Assistance - 24 hour    Equipment Recommendations  Rolling walker with 5" wheels    Recommendations for Other Services       Precautions / Restrictions Precautions Precautions: Fall Restrictions Weight Bearing Restrictions: No  Giant hiatal hernia     Mobility  Bed Mobility Overal bed mobility: Needs Assistance Bed Mobility: Supine to Sit     Supine to sit: Mod  assist;HOB elevated     General bed mobility comments: assist for LE's and trunk supine to sit with use of bed rail  Transfers Overall transfer level: Needs assistance Equipment used: Rolling walker (2 wheeled) Transfers: Sit to/from [facility] Sit to Stand: Min guard Stand pivot transfers: Min guard       General transfer comment: pt required cueing to push off of bed to stand (d/t pt initially unable to stand on own) and then pt CGA to stand; pt required 2 assist to stand from toilet (low toilet and pt normally has toilet riser at home she uses)  Ambulation/Gait Ambulation/Gait assistance: Min guard Ambulation Distance (Feet): (25 feet; 60 feet) Assistive device: Rolling walker (2 wheeled)   Gait velocity: decreased   General Gait Details: decreased B step length/foot clearance/heelstrike; narrow BOS; limited distance ambulating d/t HR elevation to 133 bpm with activity  Stairs            Wheelchair Mobility    Modified Rankin (Stroke Patients Only)       Balance Overall balance assessment: Needs assistance;History of Falls Sitting-balance support: No upper extremity supported;Feet supported Sitting balance-Leahy Scale: Good Sitting balance - Comments: steady sitting reaching within BOS   Standing balance support: Bilateral upper extremity supported Standing balance-Leahy Scale: Poor Standing balance comment: pt requires B UE support for standing activities                             Pertinent Vitals/Pain Pain Assessment: Faces Faces Pain Scale: Hurts  little more(with mobility) Pain Location: back area in general Pain Descriptors / Indicators: Sore Pain Intervention(s): Limited activity within patient's tolerance;Monitored during session;Repositioned     Home Living Family/patient expects to be discharged to:: Private residence Living Arrangements: Children([name] and son-in-law) Available Help at Discharge:  Family;Personal care attendant;Available 24 hours/day Type of Home: House Home Access: Stairs to enter   [employer] of Steps: 3 steps with R grab bar Home Layout: One level Home Equipment: Walker - 4 wheels;Bedside commode;Toilet riser(transport chair (missing leg rests))      Prior Function Level of Independence: Needs assistance   Gait / Transfers Assistance Needed: Family tries to provide 24/7 assist and always has someone with pt with transfers/ambulation d/t h/o unsteadiness (no other falls in last 6 months except most current fall prior to hospital admission)  ADL's / Homemaking Assistance Needed: Takes sponge baths  Comments: Pt has PCS services 5 days per week for 5.5 hours per day.     Hand Dominance        Extremity/Trunk Assessment   Upper Extremity Assessment Upper Extremity Assessment: Generalized weakness    Lower Extremity Assessment Lower Extremity Assessment: Generalized weakness    Cervical / Trunk Assessment Cervical / Trunk Assessment: Kyphotic  Communication   Communication: No difficulties  Cognition Arousal/Alertness: Awake/alert Behavior During Therapy: WFL for tasks assessed/performed Overall Cognitive Status: (Oriented to person; h/o cognitive impairments)                                        General Comments General comments (skin integrity, edema, etc.): Pt resting in bed upon PT entry; pt's daughter present beginning and end of session (left during session).  Nursing cleared pt for participation in physical therapy.  Pt agreeable to PT session.    Exercises     Assessment/Plan    PT Assessment Patient needs continued PT services  PT Problem List Decreased strength;Decreased balance;Decreased activity tolerance;Decreased mobility;Decreased cognition;Decreased knowledge of use of DME;Decreased safety awareness;Decreased knowledge of precautions;Cardiopulmonary status limiting activity;Pain       PT  Treatment Interventions DME instruction;Gait training;Stair training;Functional mobility training;Therapeutic activities;Therapeutic exercise;Balance training;Patient/family education    PT Goals (Current goals can be found in the Care Plan section)  Acute Rehab PT Goals Patient Stated Goal: to go home PT Goal Formulation: With patient Time For Goal Achievement: 11/22/17 Potential to Achieve Goals: Good    Frequency Min 2X/week   Barriers to discharge        Co-evaluation               AM-PAC PT "6 Clicks" Daily Activity  Outcome Measure Difficulty turning over in bed (including adjusting bedclothes, sheets and blankets)?: Unable Difficulty moving from lying on back to sitting on the side of the bed? : Unable Difficulty sitting down on and standing up from a chair with arms (e.g., wheelchair, bedside commode, etc,.)?: Unable Help needed moving to and from a bed to chair (including a wheelchair)?: A Little Help needed walking in hospital room?: A Little Help needed climbing 3-5 steps with a railing? : A Little 6 Click Score: 12    End of Session Equipment Utilized During Treatment: Gait belt (positioned up high d/t giant hiatal hernia) Activity Tolerance: Other (comment)(Limited ambulation distance d/t HR elevation to 133 bpm) Patient left: in chair;with call bell/phone within reach;with chair alarm set;with family/visitor present Nurse Communication: Mobility status;Precautions PT Visit  Diagnosis: Unsteadiness on feet (R26.81);Other abnormalities of gait and mobility (R26.89);Muscle weakness (generalized) (M62.81);History of falling (Z91.81)    Time: 9458-5929 PT Time Calculation (min) (ACUTE ONLY): 48 min   Charges:   PT Evaluation $PT Eval Low Complexity: 1 Low PT Treatments $Therapeutic Activity: 8-22 mins   PT G CodesLeitha Bleak, PT 11/08/17, 5:06 PM [phone]

## 2017-11-09 ENCOUNTER — Observation Stay: Payer: Medicare Other

## 2017-11-09 LAB — GLUCOSE, CAPILLARY: GLUCOSE-CAPILLARY: 86 mg/dL (ref 65–99)

## 2017-11-09 LAB — ECHOCARDIOGRAM COMPLETE
Height: 63 in
WEIGHTICAEL: 1920 [oz_av]

## 2017-11-09 LAB — URINE CULTURE: Culture: NO GROWTH

## 2017-11-09 MED ORDER — SODIUM CHLORIDE 0.9% FLUSH
3.0000 mL | Freq: Two times a day (BID) | INTRAVENOUS | Status: DC
Start: 2017-11-09 — End: 2017-11-09
  Administered 2017-11-09: 3 mL via INTRAVENOUS

## 2017-11-09 MED ORDER — ENSURE ENLIVE PO LIQD
237.0000 mL | Freq: Two times a day (BID) | ORAL | 0 refills | Status: DC
Start: 2017-11-09 — End: 2018-03-03

## 2017-11-09 MED ORDER — SODIUM CHLORIDE 0.9 % IV SOLN
1.0000 g | INTRAVENOUS | Status: DC
  Administered 2017-11-09: 1 g via INTRAVENOUS
  Filled 2017-11-09: qty 10

## 2017-11-09 MED ORDER — ADULT MULTIVITAMIN W/MINERALS CH: 1.0000 | ORAL_TABLET | Freq: Every day | ORAL | 0 refills | Status: AC

## 2017-11-09 MED ORDER — CEPHALEXIN 500 MG PO CAPS: 500.0000 mg | ORAL_CAPSULE | Freq: Two times a day (BID) | ORAL | 0 refills | Status: AC

## 2017-11-09 NOTE — Progress Notes (Signed)
Notified MD of bp. No new orders at this time. Will continue to monitor and assess.

## 2017-11-09 NOTE — Care Management (Signed)
Discharge to home today per Dr. Marla Roe with [facility], [facility] representative. Will see if when the bed is delivered the wheelchair can be delivered at that time. [facility] will be following in the home.  Nurse Bethel Born updated.  Family will transport. Shelbie Ammons RN MSN CCM Care Management [phone]

## 2017-11-09 NOTE — Discharge Summary (Signed)
[place] at [facility] NAME: Kelly Mcbride    MR#:  [id number]  DATE OF BIRTH:  04-28-1923  DATE OF ADMISSION:  11/07/2017 ADMITTING PHYSICIAN: Amelia Jo, MD  DATE OF DISCHARGE: 11/09/2017  PRIMARY CARE PHYSICIAN: Albina Billet, MD    ADMISSION DIAGNOSIS:  Dehydration [E86.0] Confusion [R41.0] Elevated troponin [R74.8] Fall, initial encounter [W19.XXXA]  DISCHARGE DIAGNOSIS:  Active Problems:   Malnutrition of moderate degree   SECONDARY DIAGNOSIS:   Past Medical History:  Diagnosis Date  . 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 Breast cancer ([place]) 01/10/2017  . Cardiomyopathy, secondary ([place]) 06/07/2017  . CHF (congestive heart failure) ([place]) 06/07/2017  . Incomplete emptying of bladder 01/29/2014  . Kidney cyst, acquired 01/10/2017  . Knee pain 04/28/2016  . Low back pain 09/25/2012  . Mixed urge and stress incontinence 09/25/2012  . Nephrolithiasis 01/10/2017    HOSPITAL COURSE:   1.  Rib fractures and satisfactory pain.  X-ray of the ribs shows 3 rib fractures on the right.  Family does not want any pain medications besides Tylenol.  Ribs take 6-8 weeks to heal.  Incentive spirometry ordered. 2.  Acute cystitis without hematuria.  We started Rocephin and sent off a urine culture.  Keflex prescribed upon discharge home. 3.  Weakness and falls.  Physical therapy recommended home health.  We are waiting for a hospital bed to be delivered at home. 4.  Large hiatal hernia on PPI 5.  Dementia without behavioral disturbance 6.  Glaucoma on eyedrops 7.  Moderate malnutrition on Ensure.  DISCHARGE CONDITIONS:   satisisfactory  CONSULTS OBTAINED:  Treatment Team:  Teodoro Spray, MD  DRUG ALLERGIES:   Allergies  Allergen Reactions  . Sulfa Antibiotics Hives and Rash  . Aspirin   . Ciprofloxacin     Other reaction(s): Other (See Comments) Per patient she get upset stomach and  dizziness.   . Ibuprofen     Other reaction(s): Other (See Comments) Because of hiatal hernia  Other reaction(s): Other (See Comments) Because of hiatal hernia    . Penicillins Other (See Comments)    Has patient had a PCN reaction causing immediate rash, facial/tongue/throat swelling, SOB or lightheadedness with hypotension: Unknown Has patient had a PCN reaction causing severe rash involving mucus membranes or skin necrosis: Unknown Has patient had a PCN reaction that required hospitalization: Unknown Has patient had a PCN reaction occurring within the last 10 years: Unknown If all of the above answers are "NO", then may proceed with Cephalosporin use.     DISCHARGE MEDICATIONS:   Allergies as of 11/09/2017      Reactions   Sulfa Antibiotics Hives, Rash   Aspirin    Ciprofloxacin    Other reaction(s): Other (See Comments) Per patient she get upset stomach and dizziness.    Ibuprofen    Other reaction(s): Other (See Comments) Because of hiatal hernia  Other reaction(s): Other (See Comments) Because of hiatal hernia    Penicillins Other (See Comments)   Has patient had a PCN reaction causing immediate rash, facial/tongue/throat swelling, SOB or lightheadedness with hypotension: Unknown Has patient had a PCN reaction causing severe rash involving mucus membranes or skin necrosis: Unknown Has patient had a PCN reaction that required hospitalization: Unknown Has patient had a PCN reaction occurring within the last 10 years: Unknown If all of the above answers are "NO", then may proceed with Cephalosporin use.  Medication List    TAKE these medications   acetaminophen 500 MG tablet Commonly known as:  TYLENOL Take 1,000 mg by mouth daily as needed for mild pain.   cephALEXin 500 MG capsule Commonly known as:  KEFLEX Take 1 capsule (500 mg total) by mouth 2 (two) times daily for 5 days. Start taking on:  11/10/2017   docusate sodium 100 MG capsule Commonly known as:   COLACE Take 100 mg by mouth 2 (two) times daily as needed for mild constipation.   dorzolamide 2 % ophthalmic solution Commonly known as:  TRUSOPT Place 1 drop into the right eye 2 (two) times daily.   feeding supplement (ENSURE ENLIVE) Liqd Take 237 mLs by mouth 2 (two) times daily between meals.   FISH OIL PO Take 1 capsule by mouth daily.   LUMIGAN 0.01 % Soln Generic drug:  bimatoprost Instill 1 drop in both eyes at bedtime.   multivitamin with minerals Tabs tablet Take 1 tablet by mouth daily.   omeprazole 20 MG capsule Commonly known as:  PRILOSEC Take 20 mg by mouth daily.   TH VITAMIN E 400 UNIT capsule Generic drug:  vitamin E Take 400 Units by mouth daily.   vitamin C 1000 MG tablet Take 1,000 mg by mouth.            Durable Medical Equipment  (From admission, onward)        Start     Ordered   11/09/17 0759  For home use only DME wheelchair cushion (seat and back)  Once    Comments:  For getting out of the house to church   11/09/17 0758   11/08/17 1440  For home use only DME Hospital bed  Once    Question Answer Comment  Patient has (list medical condition): chronic lower back pain   The above medical condition requires: Patient requires the ability to reposition frequently   Head must be elevated greater than: 30 degrees   Bed type Semi-electric      11/08/17 1440   11/08/17 1016  For home use only DME Hospital bed  Once    Question Answer Comment  Patient has (list medical condition): bedbound, hiatal hernia   The above medical condition requires: Patient requires the ability to reposition frequently   Head must be elevated greater than: 30 degrees   Bed type Semi-electric   [employer] Yes   Trapeze Bar Yes   Support Surface: Low Air loss Mattress      11/08/17 1015       DISCHARGE INSTRUCTIONS:   Follow-up PMD 6 days  If you experience worsening of your admission symptoms, develop shortness of breath, life threatening emergency,  suicidal or homicidal thoughts you must seek medical attention immediately by calling 911 or calling your MD immediately  if symptoms less severe.  You Must read complete instructions/literature along with all the possible adverse reactions/side effects for all the Medicines you take and that have been prescribed to you. Take any new Medicines after you have completely understood and accept all the possible adverse reactions/side effects.   Please note  You were cared for by a hospitalist during your hospital stay. If you have any questions about your discharge medications or the care you received while you were in the hospital after you are discharged, you can call the unit and asked to speak with the hospitalist on call if the hospitalist that took care of you is not available. Once you are discharged,  your primary care physician will handle any further medical issues. Please note that NO REFILLS for any discharge medications will be authorized once you are discharged, as it is imperative that you return to your primary care physician (or establish a relationship with a primary care physician if you do not have one) for your aftercare needs so that they can reassess your need for medications and monitor your lab values.    Today   CHIEF COMPLAINT:   Chief Complaint  Patient presents with  . Fall  . Shortness of Breath  . Altered Mental Status    HISTORY OF PRESENT ILLNESS:  Mertice Uffelman  is a 84 y.o. female presented after a fall and has chest pain.   VITAL SIGNS:  Blood pressure (!) 156/83, pulse 81, temperature 97.8 F (36.6 C), temperature source Oral, resp. rate 18, height 5\' 3"  (1.6 m), weight 54.9 kg (121 lb), SpO2 95 %.    PHYSICAL EXAMINATION:  GENERAL:  84 y.o.-year-old patient lying in the bed with no acute distress.  EYES: Pupils equal, round, reactive to light and accommodation. No scleral icterus. Extraocular muscles intact.  HEENT: Head atraumatic, normocephalic.  Oropharynx and nasopharynx clear.  NECK:  Supple, no jugular venous distention. No thyroid enlargement, no tenderness.  LUNGS: Normal breath sounds bilaterally, no wheezing, rales,rhonchi or crepitation. No use of accessory muscles of respiration.  CARDIOVASCULAR: S1, S2 normal. No murmurs, rubs, or gallops.  Pain when palpating over the ribs on the right side. ABDOMEN: Soft, non-tender, non-distended. Bowel sounds present. No organomegaly or mass.  EXTREMITIES: No pedal edema, cyanosis, or clubbing.  NEUROLOGIC: Cranial nerves II through XII are intact. Muscle strength 5/5 in all extremities. Sensation intact. Gait not checked.  PSYCHIATRIC: The patient is alert and answers questions SKIN: No obvious rash, lesion, or ulcer.   DATA REVIEW:   CBC Recent Labs  Lab 11/07/17 1721  WBC 10.4  HGB 14.9  HCT 44.1  PLT 282    Chemistries  Recent Labs  Lab 11/07/17 1721 11/08/17 0425  NA 138 141  K 3.8 3.9  CL 105 110  CO2 26 22  GLUCOSE 118* 94  BUN 20 18  CREATININE 1.10* 1.15*  CALCIUM 10.4* 9.9  AST 22  --   ALT 24  --   ALKPHOS 112  --   BILITOT 0.8  --     Cardiac Enzymes Recent Labs  Lab 11/08/17 0425  TROPONINI <0.03      RADIOLOGY:  Dg Chest 2 View  Result Date: 11/07/2017 CLINICAL DATA:  84 y/o  F; fall with head injury. EXAM: CHEST - 2 VIEW COMPARISON:  09/18/2012 chest radiograph. FINDINGS: Stable cardiac silhouette given projection and technique. Giant hiatal hernia. Lung bases are obscured. Pulmonary venous hypertension. Consolidation in the mid and upper lung zones. Surgical clips in left axilla. Calcific atherosclerosis of the aorta. No acute osseous abnormality is evident. IMPRESSION: Giant hiatal hernia obscuring the lung bases. Pulmonary venous hypertension. Aortic atherosclerosis. Electronically Signed   By: Kristine Garbe M.D.   On: 11/07/2017 17:59   Dg Ribs Unilateral Right  Result Date: 11/09/2017 CLINICAL DATA:  Anterior right rib pain  EXAM: RIGHT RIBS - 2 VIEW COMPARISON:  Chest x-ray 11/07/2017 FINDINGS: Slight angulation of the cortex along the anterior right 7th rib. There is also rib fractures noted in the 4th and 5th ribs posteriorly. Giant hiatal hernia again noted in the right lower chest. No pneumothorax. IMPRESSION: Visible rib fractures in the posterior 4th and 5th  ribs and anterior 7th rib on the right. No visible pneumothorax. Electronically Signed   By: Rolm Baptise M.D.   On: 11/09/2017 09:28   Ct Head Wo Contrast  Result Date: 11/07/2017 CLINICAL DATA:  [place] and hit head on dresser last night. Laceration and hematoma. Increased confusion over baseline. Initial encounter. EXAM: CT HEAD WITHOUT CONTRAST TECHNIQUE: Contiguous axial images were obtained from the base of the skull through the vertex without intravenous contrast. COMPARISON:  11/27/2005 FINDINGS: Brain: There is no evidence of acute infarct, intracranial hemorrhage, mass, midline shift, or extra-axial fluid collection. Moderate cerebral atrophy has progressed. Patchy to confluent cerebral white matter hypodensities have also progressed and are nonspecific but compatible with moderate chronic small vessel ischemic disease. Vascular: Calcified atherosclerosis at the skull base. No hyperdense vessel. Skull: No fracture or suspicious osseous lesion. Sinuses/Orbits: Visualized paranasal sinuses and mastoid air cells are clear. Bilateral cataract extraction is noted. Other: None. IMPRESSION: 1. No evidence of acute intracranial abnormality. 2. Progressive chronic small vessel ischemic disease and cerebral atrophy. Electronically Signed   By: Logan Bores M.D.   On: 11/07/2017 18:14      Management plans discussed with the patient, family and they are in agreement.  CODE STATUS:     Code Status Orders  (From admission, onward)        Start     Ordered   11/08/17 1017  Do not attempt resuscitation (DNR)  Continuous    Question Answer Comment  In the event  of cardiac or respiratory ARREST Do not call a "code blue"   In the event of cardiac or respiratory ARREST Do not perform Intubation, CPR, defibrillation or ACLS   In the event of cardiac or respiratory ARREST Use medication by any route, position, wound care, and other measures to relive pain and suffering. May use oxygen, suction and manual treatment of airway obstruction as needed for comfort.   Comments nurse may pronounce      11/08/17 1016    Code Status History    Date Active Date Inactive Code Status Order ID Comments User Context   11/07/2017 2231 11/08/2017 1016 Full Code [id number]  Amelia Jo, MD ED    Advance Directive Documentation     Most Recent Value  Type of Advance Directive  Healthcare Power of [place], Living will  Pre-existing out of facility DNR order (yellow form or pink MOST form)  -  "MOST" Form in Place?  -      TOTAL TIME TAKING CARE OF THIS PATIENT: 35 minutes.    Loletha Grayer M.D on 11/09/2017 at 11:56 AM  Between 7am to 6pm - Pager - [phone]  After 6pm go to [email or web address] - password [email or web address] [facility]  [phone]  CC: Primary care physician; Albina Billet, MD

## 2017-11-21 ENCOUNTER — Ambulatory Visit (INDEPENDENT_AMBULATORY_CARE_PROVIDER_SITE_OTHER): Payer: Medicare Other | Admitting: Family Medicine

## 2017-11-21 VITALS — BP 131/84 | HR 99 | Ht 63.0 in

## 2017-11-21 DIAGNOSIS — R300 Dysuria: Secondary | ICD-10-CM

## 2017-11-21 LAB — URINALYSIS, COMPLETE
BILIRUBIN UA: NEGATIVE
GLUCOSE, UA: NEGATIVE
KETONES UA: NEGATIVE
Nitrite, UA: NEGATIVE
PH UA: 5.5 (ref 5.0–7.5)
PROTEIN UA: NEGATIVE
UUROB: 0.2 mg/dL (ref 0.2–1.0)

## 2017-11-21 LAB — MICROSCOPIC EXAMINATION: RBC, UA: NONE SEEN /hpf (ref 0–2)

## 2017-11-21 NOTE — Progress Notes (Signed)
Patient presents today with her daughter for urinary frequency, flank pain and dysuria. She has been on multiple ABX since February. She has not had any Urological surgeries in the last 30 days. On Feb. 28 she was given Cipro 250mg  bid 10 tablets, 3/19 Cefuroxime 250mg  BID 14 tablets, 4/11 Nitrofurantoin 100mg  (she took this medication with major side effects and this medication has been put on her allergy list). 4/16,4/17 she was in hospital for IV Rocephin and then sent home on 4/18 for Cephalexin 500mg  QD 10 tablets. A urine was collected today for UA and UCX.

## 2017-11-25 LAB — CULTURE, URINE COMPREHENSIVE

## 2017-11-26 ENCOUNTER — Other Ambulatory Visit: Payer: Self-pay | Admitting: Urology

## 2017-11-26 MED ORDER — DOXYCYCLINE HYCLATE 100 MG PO CAPS: 100.0000 mg | ORAL_CAPSULE | Freq: Two times a day (BID) | ORAL | 0 refills | Status: DC

## 2017-11-27 ENCOUNTER — Telehealth: Payer: Self-pay

## 2017-11-27 NOTE — Telephone Encounter (Signed)
Pt informed

## 2017-11-27 NOTE — Telephone Encounter (Signed)
-----   Message from Abbie Sons, MD sent at 11/26/2017  5:37 PM EDT ----- Urine cx positive- antibiotic rx sent to pharmacy

## 2017-12-26 ENCOUNTER — Encounter: Payer: Self-pay | Admitting: Urology

## 2017-12-26 ENCOUNTER — Ambulatory Visit (INDEPENDENT_AMBULATORY_CARE_PROVIDER_SITE_OTHER): Payer: Medicare Other | Admitting: Urology

## 2017-12-26 VITALS — BP 155/83 | HR 99 | Ht 61.0 in | Wt 124.4 lb

## 2017-12-26 DIAGNOSIS — R399 Unspecified symptoms and signs involving the genitourinary system: Secondary | ICD-10-CM

## 2017-12-26 DIAGNOSIS — N390 Urinary tract infection, site not specified: Secondary | ICD-10-CM | POA: Diagnosis not present

## 2017-12-26 DIAGNOSIS — R339 Retention of urine, unspecified: Secondary | ICD-10-CM | POA: Diagnosis not present

## 2017-12-26 LAB — URINALYSIS, COMPLETE
Bilirubin, UA: NEGATIVE
Glucose, UA: NEGATIVE
KETONES UA: NEGATIVE
NITRITE UA: NEGATIVE
Protein, UA: NEGATIVE
SPEC GRAV UA: 1.015 (ref 1.005–1.030)
Urobilinogen, Ur: 0.2 mg/dL (ref 0.2–1.0)
pH, UA: 7 (ref 5.0–7.5)

## 2017-12-26 LAB — MICROSCOPIC EXAMINATION

## 2017-12-26 LAB — BLADDER SCAN AMB NON-IMAGING

## 2017-12-26 MED ORDER — FLUCONAZOLE 150 MG PO TABS: 150.0000 mg | ORAL_TABLET | Freq: Once | ORAL | 0 refills | Status: AC

## 2017-12-26 NOTE — Addendum Note (Signed)
Addended by: Donalee Citrin on: 12/26/2017 02:21 PM   Modules accepted: Orders

## 2017-12-26 NOTE — Progress Notes (Signed)
12/26/2017 1:31 PM   Kelly Mcbride 01-19-1923 [id number]  Referring provider: Albina Billet, MD [address]   [place], [place] [address]  Chief Complaint  Patient presents with  . Urinary Tract Infection  . Vaginal Atrophy    HPI: 84 year old female with a history of recurrent stone disease who recently began to have recurrent urinary tract infections.  She was seen back in November and had a distal ureteral calculus which she subsequently passed.  Her stone symptoms resolved and she denies flank or abdominal pain.  She has been treated for recurrent UTIs with symptoms of increased mental confusion.  She does have baseline frequency, urgency and urinary hesitancy.  Her daughter states she is complaining of burning however it is when she is wiping and not when voiding.  Denies fever, chills or gross hematuria.  She started taking cranberry tablets 3 weeks ago.   PMH: Past Medical History:  Diagnosis Date  . 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 Breast cancer ([place]) 01/10/2017  . Cardiomyopathy, secondary ([place]) 06/07/2017  . CHF (congestive heart failure) ([place]) 06/07/2017  . Incomplete emptying of bladder 01/29/2014  . Kidney cyst, acquired 01/10/2017  . Knee pain 04/28/2016  . Low back pain 09/25/2012  . Mixed urge and stress incontinence 09/25/2012  . Nephrolithiasis 01/10/2017    Surgical History: Past Surgical History:  Procedure Laterality Date  . APPENDECTOMY    . BREAST SURGERY    . EXTRACORPOREAL SHOCK WAVE LITHOTRIPSY Right 07/06/2017   Procedure: EXTRACORPOREAL SHOCK WAVE LITHOTRIPSY (ESWL);  Surgeon: Abbie Sons, MD;  Location: [facility] ORS;  Service: Urology;  Laterality: Right;    Home Medications:  Allergies as of 12/26/2017      Reactions   Nitrofurantoin    Other reaction(s): Vomiting   Sulfa Antibiotics Hives, Rash   Aspirin    Ciprofloxacin    Other reaction(s): Other (See Comments) Per patient she  get upset stomach and dizziness.    Ibuprofen    Other reaction(s): Other (See Comments) Because of hiatal hernia  Other reaction(s): Other (See Comments) Because of hiatal hernia    Penicillins Other (See Comments)   Has patient had a PCN reaction causing immediate rash, facial/tongue/throat swelling, SOB or lightheadedness with hypotension: Unknown Has patient had a PCN reaction causing severe rash involving mucus membranes or skin necrosis: Unknown Has patient had a PCN reaction that required hospitalization: Unknown Has patient had a PCN reaction occurring within the last 10 years: Unknown If all of the above answers are "NO", then may proceed with Cephalosporin use.      Medication List        Accurate as of 12/26/17  1:31 PM. Always use your most recent med list.          acetaminophen 500 MG tablet Commonly known as:  TYLENOL Take 1,000 mg by mouth daily as needed for mild pain.   docusate sodium 100 MG capsule Commonly known as:  COLACE Take 100 mg by mouth 2 (two) times daily as needed for mild constipation.   dorzolamide 2 % ophthalmic solution Commonly known as:  TRUSOPT Place 1 drop into the right eye 2 (two) times daily.   dorzolamide-timolol 22.3-6.8 MG/ML ophthalmic solution Commonly known as:  COSOPT   feeding supplement (ENSURE ENLIVE) Liqd Take 237 mLs by mouth 2 (two) times daily between meals.   ENSURE ENLIVE PO Take by mouth.   FISH OIL PO  Take 1 capsule by mouth daily.   furosemide 20 MG tablet Commonly known as:  LASIX   latanoprost 0.005 % ophthalmic solution Commonly known as:  XALATAN Apply to eye.   LUMIGAN 0.01 % Soln Generic drug:  bimatoprost Instill 1 drop in both eyes at bedtime.   multivitamin with minerals Tabs tablet Take 1 tablet by mouth daily.   Omega-3-6-9 Caps Take by mouth.   omeprazole 20 MG capsule Commonly known as:  PRILOSEC Take 20 mg by mouth daily.   phenazopyridine 200 MG tablet Commonly known as:   PYRIDIUM Take by mouth.   potassium chloride 10 MEQ tablet Commonly known as:  K-DUR Take by mouth.   vitamin C 1000 MG tablet Take 1,000 mg by mouth.   vitamin E 400 UNIT capsule Take by mouth.       Allergies:  Allergies  Allergen Reactions  . Nitrofurantoin     Other reaction(s): Vomiting  . Sulfa Antibiotics Hives and Rash  . Aspirin   . Ciprofloxacin     Other reaction(s): Other (See Comments) Per patient she get upset stomach and dizziness.   . Ibuprofen     Other reaction(s): Other (See Comments) Because of hiatal hernia  Other reaction(s): Other (See Comments) Because of hiatal hernia    . Penicillins Other (See Comments)    Has patient had a PCN reaction causing immediate rash, facial/tongue/throat swelling, SOB or lightheadedness with hypotension: Unknown Has patient had a PCN reaction causing severe rash involving mucus membranes or skin necrosis: Unknown Has patient had a PCN reaction that required hospitalization: Unknown Has patient had a PCN reaction occurring within the last 10 years: Unknown If all of the above answers are "NO", then may proceed with Cephalosporin use.     Family History: History reviewed. No pertinent family history.  Social History:  reports that she has never smoked. She has never used smokeless tobacco. She reports that she does not drink alcohol or use drugs.  ROS: UROLOGY Frequent Urination?: Yes Hard to postpone urination?: No Burning/pain with urination?: Yes Get up at night to urinate?: No Leakage of urine?: No Urine stream starts and stops?: No Trouble starting stream?: Yes Do you have to strain to urinate?: Yes Blood in urine?: No Urinary tract infection?: No Sexually transmitted disease?: No Injury to kidneys or bladder?: No Painful intercourse?: No Weak stream?: No Currently pregnant?: No Vaginal bleeding?: No Last menstrual period?: N/A  Gastrointestinal Nausea?: No Vomiting?:  No Indigestion/heartburn?: No Diarrhea?: No Constipation?: No  Constitutional Fever: No Night sweats?: No Weight loss?: No Fatigue?: No  Skin Skin rash/lesions?: No Itching?: No  Eyes Blurred vision?: No Double vision?: No  Ears/Nose/Throat Sore throat?: No Sinus problems?: No  Hematologic/Lymphatic Swollen glands?: No Easy bruising?: No  Cardiovascular Leg swelling?: No Chest pain?: No  Respiratory Cough?: No Shortness of breath?: No  Endocrine Excessive thirst?: No  Musculoskeletal Back pain?: No Joint pain?: No  Neurological Headaches?: No Dizziness?: No  Psychologic Depression?: No Anxiety?: No  Physical Exam: BP (!) 155/83 (BP Location: Left Arm, Patient Position: Sitting, Cuff Size: Normal)   Pulse 99   Ht 5\' 1"  (1.549 m)   Wt 124 lb 6.4 oz (56.4 kg)   SpO2 99%   BMI 23.51 kg/m   Constitutional:  Alert and oriented, No acute distress. HEENT: [place] AT, moist mucus membranes.  Trachea midline, no masses. Cardiovascular: No clubbing, cyanosis, or edema. Respiratory: Normal respiratory effort, no increased work of breathing. GI: Abdomen is soft, nontender,  nondistended, no abdominal masses GU: No CVA tenderness.  The labia majora have mild erythema and tenderness.  Erythema of the labia minora without discharge. Lymph: No cervical or inguinal lymphadenopathy. Skin: No rashes, bruises or suspicious lesions. Neurologic: Grossly intact, no focal deficits, moving all 4 extremities. Psychiatric: Normal mood and affect.  Laboratory Data:  Urinalysis Dipstick 1+ leukocytes; 11-30 WBC   Assessment & Plan:   84 year old female with recurrent UTI.  Her main symptoms right now are external burning.  She has mild erythema on exam.  Will give a trial of Diflucan since she has had several courses of antibiotics.  PVR by bladder scan today was 115 mL.  Urine culture was ordered.   Abbie Sons, [place] [address] [place], [place] [address] [phone]

## 2017-12-28 LAB — CULTURE, URINE COMPREHENSIVE

## 2017-12-29 ENCOUNTER — Telehealth: Payer: Self-pay | Admitting: Family Medicine

## 2017-12-29 NOTE — Telephone Encounter (Signed)
-----   Message from Abbie Sons, MD sent at 12/29/2017  4:00 PM EDT ----- Urine culture was positive.  When I saw her earlier this week she was not having UTI symptoms.  If she is presently symptomatic would send an Rx for doxycycline 100 mg twice daily x7 days.

## 2017-12-29 NOTE — Telephone Encounter (Signed)
Spoke to patient's daughter. She is not having symptoms so she is going to wait on starting any ABX at this time.

## 2017-12-30 MED ORDER — DOXYCYCLINE HYCLATE 100 MG PO TABS: 100.0000 mg | ORAL_TABLET | Freq: Two times a day (BID) | ORAL | Status: AC

## 2017-12-30 NOTE — Addendum Note (Signed)
Addended by: Link Snuffer D III on: 12/30/2017 09:39 AM   Modules accepted: Orders

## 2018-01-01 ENCOUNTER — Other Ambulatory Visit: Payer: Self-pay

## 2018-01-01 MED ORDER — DOXYCYCLINE HYCLATE 100 MG PO CAPS: 100.0000 mg | ORAL_CAPSULE | Freq: Two times a day (BID) | ORAL | 0 refills | Status: DC

## 2018-01-24 ENCOUNTER — Ambulatory Visit (INDEPENDENT_AMBULATORY_CARE_PROVIDER_SITE_OTHER): Payer: Medicare Other

## 2018-01-24 VITALS — BP 138/79 | HR 92 | Ht 61.0 in | Wt 124.0 lb

## 2018-01-24 DIAGNOSIS — N390 Urinary tract infection, site not specified: Secondary | ICD-10-CM | POA: Diagnosis not present

## 2018-01-24 LAB — MICROSCOPIC EXAMINATION

## 2018-01-24 LAB — URINALYSIS, COMPLETE
BILIRUBIN UA: NEGATIVE
Glucose, UA: NEGATIVE
KETONES UA: NEGATIVE
NITRITE UA: NEGATIVE
Protein, UA: NEGATIVE
SPEC GRAV UA: 1.015 (ref 1.005–1.030)
Urobilinogen, Ur: 0.2 mg/dL (ref 0.2–1.0)
pH, UA: 6 (ref 5.0–7.5)

## 2018-01-24 MED ORDER — LEVOFLOXACIN 500 MG PO TABS: 500.0000 mg | ORAL_TABLET | Freq: Every day | ORAL | 0 refills | Status: DC

## 2018-01-24 NOTE — Progress Notes (Signed)
Pt presents in clinic for UTI symptoms, pt has a long history of recurrent UTIs. Pt states that she is currently having urinary frequency, difficulty postponing urine, burning and stinging with urination, as well as urinary urgency. The pt denies being on suppressive antibiotics, and has not had any recent urological surgeries. Consulted with [name] as pt's UA showed blood, significant bacteria and WBC. Larene Beach advises that we start pt on Levaquin. RX sent in, advised pt to be watchful of side effects as she has significant antibiotic allergies.

## 2018-01-26 ENCOUNTER — Telehealth: Payer: Self-pay

## 2018-01-26 NOTE — Telephone Encounter (Signed)
-----   Message from Nori Riis, PA-C sent at 01/26/2018  8:02 AM EDT ----- Please make sure to check her final culture result on Monday.  She is on Levaquin right now.

## 2018-01-27 LAB — CULTURE, URINE COMPREHENSIVE

## 2018-01-30 NOTE — Telephone Encounter (Signed)
Patient's daughter notified. Patient still complains of burning. Her daughter states she has an appointment at her PCP and she will see if there is anything they can do. She will call us back if her burning continues.

## 2018-02-05 ENCOUNTER — Telehealth: Payer: Self-pay

## 2018-02-05 DIAGNOSIS — R3129 Other microscopic hematuria: Secondary | ICD-10-CM

## 2018-02-05 NOTE — Telephone Encounter (Signed)
-----   Message from Nori Riis, PA-C sent at 02/04/2018  2:01 PM EDT ----- Mrs. Postlewait had microscopic blood in her urine and she is still having dysuria.  She will need hematuria work up with CTU and cystoscopy.

## 2018-02-05 NOTE — Telephone Encounter (Signed)
Patient's daughter notified , CTscan ordered please schedule cysto thanks

## 2018-02-28 ENCOUNTER — Emergency Department
Admission: EM | Admit: 2018-02-28 | Discharge: 2018-02-28 | Disposition: A | Discharge status: Home / Self Care | Payer: Medicare Other | Attending: Emergency Medicine | Admitting: Emergency Medicine

## 2018-02-28 ENCOUNTER — Emergency Department: Payer: Medicare Other

## 2018-02-28 ENCOUNTER — Other Ambulatory Visit: Payer: Self-pay

## 2018-02-28 ENCOUNTER — Telehealth: Payer: Self-pay | Admitting: Urology

## 2018-02-28 DIAGNOSIS — R638 Other symptoms and signs concerning food and fluid intake: Secondary | ICD-10-CM | POA: Insufficient documentation

## 2018-02-28 DIAGNOSIS — I509 Heart failure, unspecified: Secondary | ICD-10-CM | POA: Diagnosis not present

## 2018-02-28 DIAGNOSIS — Z853 Personal history of malignant neoplasm of breast: Secondary | ICD-10-CM | POA: Insufficient documentation

## 2018-02-28 DIAGNOSIS — R350 Frequency of micturition: Secondary | ICD-10-CM | POA: Diagnosis present

## 2018-02-28 DIAGNOSIS — R1030 Lower abdominal pain, unspecified: Secondary | ICD-10-CM | POA: Diagnosis not present

## 2018-02-28 DIAGNOSIS — N200 Calculus of kidney: Secondary | ICD-10-CM | POA: Insufficient documentation

## 2018-02-28 DIAGNOSIS — Z79899 Other long term (current) drug therapy: Secondary | ICD-10-CM | POA: Insufficient documentation

## 2018-02-28 LAB — URINALYSIS, COMPLETE (UACMP) WITH MICROSCOPIC
Bilirubin Urine: NEGATIVE
Glucose, UA: NEGATIVE mg/dL
Ketones, ur: NEGATIVE mg/dL
Nitrite: NEGATIVE
PROTEIN: NEGATIVE mg/dL
SPECIFIC GRAVITY, URINE: 1.013 (ref 1.005–1.030)
Squamous Epithelial / LPF: NONE SEEN (ref 0–5)
pH: 6 (ref 5.0–8.0)

## 2018-02-28 LAB — CBC WITH DIFFERENTIAL/PLATELET
BASOS ABS: 0.1 10*3/uL (ref 0–0.1)
BASOS PCT: 1 %
Eosinophils Absolute: 0.1 10*3/uL (ref 0–0.7)
Eosinophils Relative: 2 %
HEMATOCRIT: 43.1 % (ref 35.0–47.0)
HEMOGLOBIN: 14.7 g/dL (ref 12.0–16.0)
Lymphocytes Relative: 22 %
Lymphs Abs: 1.4 10*3/uL (ref 1.0–3.6)
MCH: 29.6 pg (ref 26.0–34.0)
MCHC: 34.1 g/dL (ref 32.0–36.0)
MCV: 86.9 fL (ref 80.0–100.0)
Monocytes Absolute: 0.5 10*3/uL (ref 0.2–0.9)
Monocytes Relative: 7 %
NEUTROS PCT: 68 %
Neutro Abs: 4.4 10*3/uL (ref 1.4–6.5)
Platelets: 207 10*3/uL (ref 150–440)
RBC: 4.96 MIL/uL (ref 3.80–5.20)
RDW: 13.7 % (ref 11.5–14.5)
WBC: 6.5 10*3/uL (ref 3.6–11.0)

## 2018-02-28 LAB — COMPREHENSIVE METABOLIC PANEL
ALBUMIN: 3 g/dL — AB (ref 3.5–5.0)
ALK PHOS: 78 U/L (ref 38–126)
ALT: 13 U/L (ref 0–44)
AST: 24 U/L (ref 15–41)
Anion gap: 5 (ref 5–15)
BILIRUBIN TOTAL: 0.8 mg/dL (ref 0.3–1.2)
BUN: 23 mg/dL (ref 8–23)
CALCIUM: 10.1 mg/dL (ref 8.9–10.3)
CO2: 31 mmol/L (ref 22–32)
Chloride: 108 mmol/L (ref 98–111)
Creatinine, Ser: 1.26 mg/dL — ABNORMAL HIGH (ref 0.44–1.00)
GFR calc Af Amer: 42 mL/min — ABNORMAL LOW (ref >60)
GFR calc non Af Amer: 36 mL/min — ABNORMAL LOW (ref >60)
Glucose, Bld: 89 mg/dL (ref 70–99)
Potassium: 4.3 mmol/L (ref 3.5–5.1)
Sodium: 144 mmol/L (ref 135–145)
TOTAL PROTEIN: 6.1 g/dL — AB (ref 6.5–8.1)

## 2018-02-28 LAB — TROPONIN I

## 2018-02-28 LAB — LIPASE, BLOOD: Lipase: 41 U/L (ref 11–51)

## 2018-02-28 MED ORDER — CEPHALEXIN 500 MG PO CAPS: 500.0000 mg | ORAL_CAPSULE | Freq: Two times a day (BID) | ORAL | 0 refills | Status: DC

## 2018-02-28 MED ORDER — TAMSULOSIN HCL 0.4 MG PO CAPS: 0.4000 mg | ORAL_CAPSULE | Freq: Every day | ORAL | 0 refills | Status: DC

## 2018-02-28 MED ORDER — SODIUM CHLORIDE 0.9 % IV BOLUS
500.0000 mL | Freq: Once | INTRAVENOUS | Status: AC
  Administered 2018-02-28: 500 mL via INTRAVENOUS

## 2018-02-28 MED ORDER — CEPHALEXIN 500 MG PO CAPS
500.0000 mg | ORAL_CAPSULE | Freq: Once | ORAL | Status: AC
  Administered 2018-02-28: 500 mg via ORAL
  Filled 2018-02-28: qty 1

## 2018-02-28 MED ORDER — CEPHALEXIN 500 MG PO CAPS: 500.0000 mg | ORAL_CAPSULE | Freq: Two times a day (BID) | ORAL | 0 refills | Status: AC

## 2018-02-28 MED ORDER — IOHEXOL 300 MG/ML  SOLN
75.0000 mL | Freq: Once | INTRAMUSCULAR | Status: AC | PRN
  Administered 2018-02-28: 75 mL via INTRAVENOUS

## 2018-02-28 NOTE — ED Provider Notes (Signed)
[facility] Emergency Department Provider Note ____________________________________________   First MD Initiated Contact with Patient 02/28/18 [phone]     (approximate)  I have reviewed the triage vital signs and the nursing notes.   HISTORY  Chief Complaint Urinary Frequency   HPI Kelly Mcbride is a 84 y.o. female with a history of CHF as well as dementia who is presenting to the emergency department for greater than 6 months of urinary frequency.  Over the past month she has started to complain of lower abdominal pain.  Family says that the pain can sometimes go around the clock and that she is moaning all night.  She has been on multiple antibiotics.  Has also had Monistat used.  Patient does not have a pessary.  Patient moving her bowels well and had her last bowel movement 2 days ago.  Eating well but with decreased fluid intake but the family says that this is her baseline.  No nausea or vomiting.  No diarrhea.  Family states that the patient was living independently up until this past fall 2018.  At that time the family took the patient in.  She has gained 12 pounds since.  She has not had any unexpected weight loss.  Past Medical History:  Diagnosis Date  . 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 Breast cancer ([place]) 01/10/2017  . Cardiomyopathy, secondary ([place]) 06/07/2017  . CHF (congestive heart failure) ([place]) 06/07/2017  . Incomplete emptying of bladder 01/29/2014  . Kidney cyst, acquired 01/10/2017  . Knee pain 04/28/2016  . Low back pain 09/25/2012  . Mixed urge and stress incontinence 09/25/2012  . Nephrolithiasis 01/10/2017    Patient Active Problem List   Diagnosis Date Noted  . Malnutrition of moderate degree 11/08/2017  . Cardiomyopathy, secondary ([place]) 06/07/2017  . CHF (congestive heart failure) ([place]) 06/07/2017  . Calculus of ureter 06/07/2017  . Nephrolithiasis 01/10/2017  . Kidney cyst,  acquired 01/10/2017  . 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 Breast cancer ([place]) 01/10/2017  . History of appendectomy 01/10/2017  . Knee pain 04/28/2016  . Incomplete emptying of bladder 01/29/2014  . 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 Low back pain 09/25/2012  . Mixed urge and stress incontinence 09/25/2012    Past Surgical History:  Procedure Laterality Date  . APPENDECTOMY    . BREAST SURGERY    . EXTRACORPOREAL SHOCK WAVE LITHOTRIPSY Right 07/06/2017   Procedure: EXTRACORPOREAL SHOCK WAVE LITHOTRIPSY (ESWL);  Surgeon: Abbie Sons, MD;  Location: [facility] ORS;  Service: Urology;  Laterality: Right;    Prior to Admission medications   Medication Sig Start Date End Date Taking? Authorizing Provider  acetaminophen (TYLENOL) 500 MG tablet Take 1,000 mg by mouth daily as needed for mild pain.     [provider]  Ascorbic Acid (VITAMIN C) 1000 MG tablet Take 1,000 mg by mouth. 09/25/12   [provider]  bimatoprost (LUMIGAN) 0.01 % SOLN Instill 1 drop in both eyes at bedtime.    [provider]  docusate sodium (COLACE) 100 MG capsule Take 100 mg by mouth 2 (two) times daily as needed for mild constipation.     [provider]  dorzolamide (TRUSOPT) 2 % ophthalmic solution Place 1 drop into the right eye 2 (two) times daily.    [provider]  dorzolamide-timolol (COSOPT) 22.3-6.8 MG/ML ophthalmic solution  06/07/17   [provider]  doxycycline (VIBRAMYCIN)  100 MG capsule Take 1 capsule (100 mg total) by mouth every 12 (twelve) hours. 01/01/18   Stoioff, [name], MD  feeding supplement, ENSURE ENLIVE, (ENSURE ENLIVE) LIQD Take 237 mLs by mouth 2 (two) times daily between meals. 11/09/17   Loletha Grayer, MD  furosemide (LASIX) 20 MG tablet  12/14/17   [provider]  latanoprost (XALATAN) 0.005 % ophthalmic solution Apply to eye.    [provider]  levofloxacin (LEVAQUIN) 500 MG tablet Take 1  tablet (500 mg total) by mouth daily. 01/24/18   [name] A, PA-C  Multiple Vitamin (MULTIVITAMIN WITH MINERALS) TABS tablet Take 1 tablet by mouth daily. 11/09/17   Loletha Grayer, MD  Nutritional Supplements (ENSURE ENLIVE PO) Take by mouth. 11/09/17   [provider]  Omega-3 Fatty Acids (FISH OIL PO) Take 1 capsule by mouth daily.     [provider]  Omega-3-6-9 CAPS Take by mouth.    [provider]  omeprazole (PRILOSEC) 20 MG capsule Take 20 mg by mouth daily.  11/01/13   [provider]  phenazopyridine (PYRIDIUM) 200 MG tablet Take by mouth. 11/14/17   [provider]  potassium chloride (K-DUR) 10 MEQ tablet Take by mouth. 11/14/17 11/14/18  [provider]  vitamin E 400 UNIT capsule Take by mouth.    [provider]    Allergies Nitrofurantoin; Sulfa antibiotics; Aspirin; Ciprofloxacin; Ibuprofen; and Penicillins  No family history on file.  Social History Social History   Tobacco Use  . Smoking status: Never Smoker  . Smokeless tobacco: Never Used  Substance Use Topics  . Alcohol use: No  . Drug use: No    Review of Systems  Constitutional: No fever/chills Eyes: No visual changes. ENT: No sore throat. Cardiovascular: Denies chest pain. Respiratory: Caretaker reports a brief period of shortness of breath yesterday which resolved.  History of CHF. Gastrointestinal: No nausea, no vomiting.  No diarrhea.  No constipation. Genitourinary: Negative for dysuria. Musculoskeletal: Negative for back pain. Skin: Negative for rash. Neurological: Negative for headaches, focal weakness or numbness.   ____________________________________________   PHYSICAL EXAM:  VITAL SIGNS: ED Triage Vitals  Enc Vitals Group     BP 02/28/18 0851 134/77     Pulse Rate 02/28/18 0851 82     Resp 02/28/18 0851 18     Temp 02/28/18 0851 98.2 F (36.8 C)     Temp Source 02/28/18 0851 Oral     SpO2 02/28/18 0851 98 %      Weight 02/28/18 0852 125 lb (56.7 kg)     Height 02/28/18 0852 5\' 3"  (1.6 m)     Head Circumference --      Peak Flow --      Pain Score 02/28/18 0851 10     Pain Loc --      Pain Edu? --      Excl. in [place]? --     Constitutional: Alert and oriented. Well appearing and in no acute distress. Eyes: Conjunctivae are normal.  Head: Atraumatic. Nose: No congestion/rhinnorhea. Mouth/Throat: Mucous membranes are moist.  Neck: No stridor.   Cardiovascular: Normal rate, regular rhythm. Grossly normal heart sounds.  Respiratory: Normal respiratory effort.  No retractions. Lungs CTAB. Gastrointestinal: Soft with moderate lower abdominal tenderness to palpation across lower abdomen.  Minimal tenderness across the upper abdomen. No distention.  Musculoskeletal: No lower extremity tenderness nor edema.  No joint effusions. Neurologic:  Normal speech and language. No gross focal neurologic deficits are appreciated. Skin:  Skin is warm, dry and intact. No rash noted.  Groin examined as well without any erythema.  Specifically checked within the skin folds as well and they appear normal. Psychiatric: Mood and affect are normal. Speech and behavior are normal.  ____________________________________________   LABS (all labs ordered are listed, but only abnormal results are displayed)  Labs Reviewed  COMPREHENSIVE METABOLIC PANEL - Abnormal; Notable for the following components:      Result Value   Creatinine, Ser 1.26 (*)    Total Protein 6.1 (*)    Albumin 3.0 (*)    GFR calc non Af Amer 36 (*)    GFR calc Af Amer 42 (*)    All other components within normal limits  URINALYSIS, COMPLETE (UACMP) WITH MICROSCOPIC - Abnormal; Notable for the following components:   Color, Urine YELLOW (*)    APPearance CLEAR (*)    Hgb urine dipstick SMALL (*)    Leukocytes, UA MODERATE (*)    Bacteria, UA RARE (*)    All other components within normal limits  URINE CULTURE  CBC WITH DIFFERENTIAL/PLATELET    LIPASE, BLOOD  TROPONIN I   ____________________________________________  EKG  ED ECG REPORT I, Doran Stabler, the attending physician, personally viewed and interpreted this ECG.   Date: 02/28/2018  EKG Time: 1012  Rate: 71  Rhythm: normal sinus rhythm  Axis: Right axis  Intervals:none  ST&T Change: No ST segment elevation or depression.  No abnormal T wave inversion.  ____________________________________________  RADIOLOGY  Chest x-ray without acute abnormality.  Massive chronic hiatal hernia.  CT of the abdomen and pelvis with an obstructing 3.4 mm stone in the distal left ureter.  Nonobstructing stones the bilateral kidneys.  Fluid in the uterus which is abnormal for patient of this age.  Recommended by the radiologist to have outpatient follow-up ultrasound.  ____________________________________________   PROCEDURES  Procedure(s) performed:   Procedures  Critical Care performed:   ____________________________________________   INITIAL IMPRESSION / ASSESSMENT AND PLAN / ED COURSE  Pertinent labs & imaging results that were available during my care of the patient were reviewed by me and considered in my medical decision making (see chart for details).  Differential diagnosis includes, but is not limited to, ovarian cyst, ovarian torsion, acute appendicitis, diverticulitis, urinary tract infection/pyelonephritis, endometriosis, bowel obstruction, colitis, renal colic, gastroenteritis, hernia, fibroids, endometriosis, pregnancy related pain including ectopic pregnancy, etc. As part of my medical decision making, I reviewed the following data within the electronic MEDICAL RECORD NUMBER Notes from prior ED visits  ----------------------------------------- 12:14 PM on 02/28/2018 -----------------------------------------  Patient at this time without any distress.  Appears to be resting comfortably.  Likely symptomatic from her left-sided kidney stone.  Patient with  normal white blood cell count.  Kidney function near baseline.  We will give her IV fluid hydration and the patient will continue with her regular 36 ounces of water per day.  We do not want to over hydrate her as she has CHF.  She will be discharged with Flomax which she has tolerated in the past.  She will also be given a course of 7 days of Keflex twice daily for the white blood cells in the urine.  I had an extensive conversation with the patient's caretaker as well as the patient's daughter regarding the diagnosis as well as treatment plan and return precautions come back for any worsening or concerning symptoms with especially any worsening abdominal pain, nausea vomiting or fever.  They are also aware  of the fluid in the uterus and will be discussing with Dr. Bernardo Heater to add a uterine ultrasound as the patient is already scheduled for an ultrasound on 23 August for what sound like her bladder and kidneys. ____________________________________________   FINAL CLINICAL IMPRESSION(S) / ED DIAGNOSES  Kidney stone.  Lower abdominal pain.  NEW MEDICATIONS STARTED DURING THIS VISIT:  New Prescriptions   No medications on file     Note:  This document was prepared using Dragon voice recognition software and may include unintentional dictation errors.     Orbie Pyo, MD 02/28/18 [phone]

## 2018-02-28 NOTE — ED Triage Notes (Signed)
Per pt daughter and caregiver. Pt has been c/o pelvic/vaginal pain for over a week, was seen by PCP last week and was negative for UTi .asked if she is red around that area and states I dont know"

## 2018-02-28 NOTE — ED Notes (Signed)
Per family, patient has been complaining of "pain inside" of pelvis for months. Also complaining of burning with urination, however this only occurs at night. Daughter states that patient has had multiple urinalysis done over the last several months which lead to patient being started on antibiotics, however when the culture comes back, it is negative, and antibiotics are stopped. Also states patient has taken diflucan without relief and was given estrogen cream to try 4 days ago without any improvement in symptoms. Daughter reports patient is seen by Dr. Bernardo Heater and is scheduled for KUB in 2 weeks but states "we can't wait that long. She's not sleeping at night because she's in so much pain."

## 2018-02-28 NOTE — Telephone Encounter (Signed)
Pt daughter states pt was seen in ER today having CT & Xray done, states they found a kidney stone, daughter wants to know if the CT workup you ordered is still needed prior to her next visit?  Please advise. Thank you.

## 2018-03-01 ENCOUNTER — Other Ambulatory Visit: Payer: Self-pay | Admitting: Internal Medicine

## 2018-03-01 DIAGNOSIS — N8500 Endometrial hyperplasia, unspecified: Secondary | ICD-10-CM

## 2018-03-01 LAB — URINE CULTURE: Culture: NO GROWTH

## 2018-03-01 NOTE — Telephone Encounter (Signed)
The ordered CT does not need to be performed.  I would recommend a KUB to see if the small ureteral stone can be seen on a regular x-ray.  There is an order already in.

## 2018-03-02 ENCOUNTER — Emergency Department: Payer: Medicare Other

## 2018-03-02 ENCOUNTER — Observation Stay
Admission: EM | Admit: 2018-03-02 | Discharge: 2018-03-04 | Disposition: A | Discharge status: Home / Self Care | Payer: Medicare Other | Attending: Internal Medicine | Admitting: Internal Medicine

## 2018-03-02 DIAGNOSIS — Z881 Allergy status to other antibiotic agents status: Secondary | ICD-10-CM | POA: Insufficient documentation

## 2018-03-02 DIAGNOSIS — Z853 Personal history of malignant neoplasm of breast: Secondary | ICD-10-CM | POA: Diagnosis not present

## 2018-03-02 DIAGNOSIS — E785 Hyperlipidemia, unspecified: Secondary | ICD-10-CM | POA: Diagnosis not present

## 2018-03-02 DIAGNOSIS — Z886 Allergy status to analgesic agent status: Secondary | ICD-10-CM | POA: Insufficient documentation

## 2018-03-02 DIAGNOSIS — Z882 Allergy status to sulfonamides status: Secondary | ICD-10-CM | POA: Insufficient documentation

## 2018-03-02 DIAGNOSIS — Z79899 Other long term (current) drug therapy: Secondary | ICD-10-CM | POA: Diagnosis not present

## 2018-03-02 DIAGNOSIS — N281 Cyst of kidney, acquired: Secondary | ICD-10-CM | POA: Insufficient documentation

## 2018-03-02 DIAGNOSIS — Z880 Allergy status to penicillin: Secondary | ICD-10-CM | POA: Insufficient documentation

## 2018-03-02 DIAGNOSIS — N132 Hydronephrosis with renal and ureteral calculous obstruction: Secondary | ICD-10-CM | POA: Insufficient documentation

## 2018-03-02 DIAGNOSIS — K449 Diaphragmatic hernia without obstruction or gangrene: Secondary | ICD-10-CM | POA: Diagnosis not present

## 2018-03-02 DIAGNOSIS — I5033 Acute on chronic diastolic (congestive) heart failure: Secondary | ICD-10-CM | POA: Diagnosis not present

## 2018-03-02 DIAGNOSIS — Z66 Do not resuscitate: Secondary | ICD-10-CM | POA: Insufficient documentation

## 2018-03-02 DIAGNOSIS — I429 Cardiomyopathy, unspecified: Secondary | ICD-10-CM | POA: Insufficient documentation

## 2018-03-02 DIAGNOSIS — I509 Heart failure, unspecified: Secondary | ICD-10-CM | POA: Diagnosis present

## 2018-03-02 DIAGNOSIS — I700 Atherosclerosis of aorta: Secondary | ICD-10-CM | POA: Diagnosis not present

## 2018-03-02 DIAGNOSIS — K219 Gastro-esophageal reflux disease without esophagitis: Secondary | ICD-10-CM | POA: Insufficient documentation

## 2018-03-02 DIAGNOSIS — R9389 Abnormal findings on diagnostic imaging of other specified body structures: Secondary | ICD-10-CM

## 2018-03-02 LAB — CBC WITH DIFFERENTIAL/PLATELET
Basophils Absolute: 0.1 10*3/uL (ref 0–0.1)
Basophils Relative: 1 %
EOS PCT: 3 %
Eosinophils Absolute: 0.2 10*3/uL (ref 0–0.7)
HEMATOCRIT: 47.1 % — AB (ref 35.0–47.0)
Hemoglobin: 15.9 g/dL (ref 12.0–16.0)
LYMPHS ABS: 1.4 10*3/uL (ref 1.0–3.6)
LYMPHS PCT: 18 %
MCH: 29.4 pg (ref 26.0–34.0)
MCHC: 33.8 g/dL (ref 32.0–36.0)
MCV: 87.2 fL (ref 80.0–100.0)
MONO ABS: 0.7 10*3/uL (ref 0.2–0.9)
MONOS PCT: 8 %
NEUTROS ABS: 5.9 10*3/uL (ref 1.4–6.5)
Neutrophils Relative %: 70 %
Platelets: 207 10*3/uL (ref 150–440)
RBC: 5.4 MIL/uL — ABNORMAL HIGH (ref 3.80–5.20)
RDW: 13.6 % (ref 11.5–14.5)
WBC: 8.2 10*3/uL (ref 3.6–11.0)

## 2018-03-02 LAB — TROPONIN I: Troponin I: <0.03 ng/mL (ref <0.03)

## 2018-03-02 LAB — BASIC METABOLIC PANEL
ANION GAP: 7 (ref 5–15)
BUN: 22 mg/dL (ref 8–23)
CALCIUM: 9.9 mg/dL (ref 8.9–10.3)
CO2: 29 mmol/L (ref 22–32)
CREATININE: 1.16 mg/dL — AB (ref 0.44–1.00)
Chloride: 106 mmol/L (ref 98–111)
GFR calc Af Amer: 46 mL/min — ABNORMAL LOW (ref >60)
GFR calc non Af Amer: 40 mL/min — ABNORMAL LOW (ref >60)
Glucose, Bld: 120 mg/dL — ABNORMAL HIGH (ref 70–99)
Potassium: 4.4 mmol/L (ref 3.5–5.1)
Sodium: 142 mmol/L (ref 135–145)

## 2018-03-02 LAB — BRAIN NATRIURETIC PEPTIDE: B Natriuretic Peptide: 84 pg/mL (ref 0.0–100.0)

## 2018-03-02 MED ORDER — FUROSEMIDE 10 MG/ML IJ SOLN
40.0000 mg | Freq: Once | INTRAMUSCULAR | Status: AC
  Administered 2018-03-02: 40 mg via INTRAVENOUS
  Filled 2018-03-02: qty 4

## 2018-03-02 NOTE — ED Notes (Signed)
Pt at this time off bipap / sat 100% on 3lt /[place]

## 2018-03-02 NOTE — ED Notes (Signed)
Pt now on 1l p/m [place] sat holding 98-99%

## 2018-03-02 NOTE — ED Triage Notes (Signed)
Pt here from home , reports sob  X 4 hours  Pt placed on bipap via ems  20g prn to rfa  1" nitro paste to chest 1 nitro spray in route  Pt a/o X3  Provider at bedside

## 2018-03-02 NOTE — ED Provider Notes (Signed)
[facility] Emergency Department Provider Note   ____________________________________________   I have reviewed the triage vital signs and the nursing notes.   HISTORY  Chief Complaint Respiratory Distress   History limited by: Respiratory distress   HPI Kelly Mcbride is a 84 y.o. female who presents to the emergency department today via EMS as emergency traffic because of concerns for shortness of breath.  Per report this started roughly 4 when EMS arrived patient was in significant respiratory distress.  They did place the patient on CPAP and that did seem to improve the patient's condition.  They also give the patient nitro.  History is somewhat limited study due to patient's respite distress.   Per medical record review patient has a history of CHF  Past Medical History:  Diagnosis Date  . 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 Breast cancer ([place]) 01/10/2017  . Cardiomyopathy, secondary ([place]) 06/07/2017  . CHF (congestive heart failure) ([place]) 06/07/2017  . Incomplete emptying of bladder 01/29/2014  . Kidney cyst, acquired 01/10/2017  . Knee pain 04/28/2016  . Low back pain 09/25/2012  . Mixed urge and stress incontinence 09/25/2012  . Nephrolithiasis 01/10/2017    Patient Active Problem List   Diagnosis Date Noted  . Malnutrition of moderate degree 11/08/2017  . Cardiomyopathy, secondary ([place]) 06/07/2017  . CHF (congestive heart failure) ([place]) 06/07/2017  . Calculus of ureter 06/07/2017  . Nephrolithiasis 01/10/2017  . Kidney cyst, acquired 01/10/2017  . 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 Breast cancer ([place]) 01/10/2017  . History of appendectomy 01/10/2017  . Knee pain 04/28/2016  . Incomplete emptying of bladder 01/29/2014  . 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 Low back pain 09/25/2012  . Mixed urge and stress incontinence 09/25/2012    Past Surgical History:  Procedure  Laterality Date  . APPENDECTOMY    . BREAST SURGERY    . EXTRACORPOREAL SHOCK WAVE LITHOTRIPSY Right 07/06/2017   Procedure: EXTRACORPOREAL SHOCK WAVE LITHOTRIPSY (ESWL);  Surgeon: Abbie Sons, MD;  Location: [facility] ORS;  Service: Urology;  Laterality: Right;    Prior to Admission medications   Medication Sig Start Date End Date Taking? Authorizing Provider  acetaminophen (TYLENOL) 500 MG tablet Take 1,000 mg by mouth daily as needed for mild pain.     [provider]  Ascorbic Acid (VITAMIN C) 1000 MG tablet Take 1,000 mg by mouth. 09/25/12   [provider]  bimatoprost (LUMIGAN) 0.01 % SOLN Instill 1 drop in both eyes at bedtime.    [provider]  cephALEXin (KEFLEX) 500 MG capsule Take 1 capsule (500 mg total) by mouth 2 (two) times daily for 10 days. 02/28/18 03/10/18  Schaevitz, Randall An, MD  docusate sodium (COLACE) 100 MG capsule Take 100 mg by mouth 2 (two) times daily as needed for mild constipation.     [provider]  dorzolamide (TRUSOPT) 2 % ophthalmic solution Place 1 drop into the right eye 2 (two) times daily.    [provider]  dorzolamide-timolol (COSOPT) 22.3-6.8 MG/ML ophthalmic solution  06/07/17   [provider]  doxycycline (VIBRAMYCIN) 100 MG capsule Take 1 capsule (100 mg total) by mouth every 12 (twelve) hours. 01/01/18   Stoioff, [name], MD  feeding supplement, ENSURE ENLIVE, (ENSURE ENLIVE) LIQD Take 237 mLs by mouth 2 (two) times daily between meals. 11/09/17   Loletha Grayer, MD  furosemide (LASIX) 20 MG tablet  12/14/17   [provider]  latanoprost (XALATAN) 0.005 % ophthalmic solution Apply to eye.    [provider]  levofloxacin (LEVAQUIN) 500 MG tablet Take 1 tablet (500 mg total) by mouth daily. 01/24/18   [name] A, PA-C  Multiple Vitamin (MULTIVITAMIN WITH MINERALS) TABS tablet Take 1 tablet by mouth daily. 11/09/17   Loletha Grayer, MD  Nutritional Supplements  (ENSURE ENLIVE PO) Take by mouth. 11/09/17   [provider]  Omega-3 Fatty Acids (FISH OIL PO) Take 1 capsule by mouth daily.     [provider]  Omega-3-6-9 CAPS Take by mouth.    [provider]  omeprazole (PRILOSEC) 20 MG capsule Take 20 mg by mouth daily.  11/01/13   [provider]  phenazopyridine (PYRIDIUM) 200 MG tablet Take by mouth. 11/14/17   [provider]  potassium chloride (K-DUR) 10 MEQ tablet Take by mouth. 11/14/17 11/14/18  [provider]  tamsulosin (FLOMAX) 0.4 MG CAPS capsule Take 1 capsule (0.4 mg total) by mouth daily. 02/28/18   Schaevitz, Randall An, MD  vitamin E 400 UNIT capsule Take by mouth.    [provider]    Allergies Nitrofurantoin; Sulfa antibiotics; Aspirin; Ciprofloxacin; Ibuprofen; and Penicillins  History reviewed. No pertinent family history.  Social History Social History   Tobacco Use  . Smoking status: Never Smoker  . Smokeless tobacco: Never Used  Substance Use Topics  . Alcohol use: No  . Drug use: No    Review of Systems Constitutional: No fever/chills Eyes: No visual changes. ENT: No sore throat. Cardiovascular: Denies chest pain. Respiratory: Positive for shortness of breath. Gastrointestinal: No abdominal pain.  No nausea, no vomiting.  No diarrhea.   Genitourinary: Negative for dysuria. Musculoskeletal: Negative for back pain. Skin: Negative for rash. Neurological: Negative for headaches, focal weakness or numbness.  ____________________________________________   PHYSICAL EXAM:  VITAL SIGNS: ED Triage Vitals  Enc Vitals Group     BP 03/02/18 2014 (!) 159/103     Pulse Rate 03/02/18 2014 (!) 108     Resp 03/02/18 2014 (!) 21     Temp --      Temp Source 03/02/18 2014 Oral     SpO2 03/02/18 2013 100 %     Weight 03/02/18 2016 125 lb (56.7 kg)     Height --      Head Circumference --      Peak Flow --      Pain Score 03/02/18 2016 0    Constitutional: Moderate respiratory distress, on CPAP.  Eyes: Conjunctivae are normal.  ENT      Head: Normocephalic and atraumatic.      Nose: No congestion/rhinnorhea.      Mouth/Throat: Mucous membranes are moist.      Neck: No stridor. Hematological/Lymphatic/Immunilogical: No cervical lymphadenopathy. Cardiovascular: Tachycardic, regular rhythm.  No murmurs, rubs, or gallops.  Respiratory: Tachypnea, moderate respiratory distress. No crackles or rhonchi appreciated Gastrointestinal: Soft and non tender. No rebound. No guarding.  Genitourinary: Deferred Musculoskeletal: Normal range of motion in all extremities. No lower extremity edema. Neurologic:  Normal speech and language. No gross focal neurologic deficits are appreciated.  Skin:  Skin is warm, dry and intact. No rash noted. Psychiatric: Mood and affect are normal. Speech and behavior are normal. Patient exhibits appropriate insight and judgment.  ____________________________________________    LABS (pertinent positives/negatives)  Trop <0.03 BMP na 142, k 4.4, glu 120, cr 1.16 CBC wbc 8.2, hgb 15.9, plt 207  ____________________________________________  EKG  I, Nance Pear, attending physician, personally viewed and interpreted this EKG  EKG Time: 2013 Rate: 110 Rhythm: sinus tachycardia Axis: normal Intervals: qtc 463 QRS: narrow ST changes: no st elevation Impression: abnormal ekg   ____________________________________________    RADIOLOGY  CHF No pulmonary edema  ____________________________________________   PROCEDURES  Procedures  ____________________________________________   INITIAL IMPRESSION / ASSESSMENT AND PLAN / ED COURSE  Pertinent labs & imaging results that were available during my care of the patient were reviewed by me and considered in my medical decision making (see chart for details).   Patient presented to the emergency department today because of concerns for  shortness of breath.  Initially placed on CPAP by EMS.  Whilst we initially switch her to BiPAP it did appear that she would be able to tolerate not being on BiPAP shortly after arrival.  She was taken off BiPAP and did okay on nasal cannula.  X-ray is concerning for some edema.  This point I think CHF exacerbation likely.  Will give patient IV Lasix plan on admission.  Discussed findings plan with patient.  ____________________________________________   FINAL CLINICAL IMPRESSION(S) / ED DIAGNOSES  Final diagnoses:  Acute on chronic congestive heart failure, unspecified heart failure type [facility])     Note: This dictation was prepared with Dragon dictation. Any transcriptional errors that result from this process are unintentional     Nance Pear, MD 03/02/18 2313

## 2018-03-02 NOTE — ED Notes (Signed)
Labs sent under temp labels

## 2018-03-03 ENCOUNTER — Observation Stay: Payer: Medicare Other

## 2018-03-03 ENCOUNTER — Other Ambulatory Visit: Payer: Self-pay

## 2018-03-03 LAB — BASIC METABOLIC PANEL
ANION GAP: 6 (ref 5–15)
BUN: 22 mg/dL (ref 8–23)
CHLORIDE: 107 mmol/L (ref 98–111)
CO2: 31 mmol/L (ref 22–32)
Calcium: 9.4 mg/dL (ref 8.9–10.3)
Creatinine, Ser: 1.26 mg/dL — ABNORMAL HIGH (ref 0.44–1.00)
GFR calc non Af Amer: 36 mL/min — ABNORMAL LOW (ref >60)
GFR, EST AFRICAN AMERICAN: 42 mL/min — AB (ref >60)
Glucose, Bld: 116 mg/dL — ABNORMAL HIGH (ref 70–99)
Potassium: 4.1 mmol/L (ref 3.5–5.1)
Sodium: 144 mmol/L (ref 135–145)

## 2018-03-03 LAB — CBC
HCT: 42.9 % (ref 35.0–47.0)
HEMOGLOBIN: 14.7 g/dL (ref 12.0–16.0)
MCH: 29.6 pg (ref 26.0–34.0)
MCHC: 34.2 g/dL (ref 32.0–36.0)
MCV: 86.6 fL (ref 80.0–100.0)
Platelets: 201 10*3/uL (ref 150–440)
RBC: 4.96 MIL/uL (ref 3.80–5.20)
RDW: 13.4 % (ref 11.5–14.5)
WBC: 7.3 10*3/uL (ref 3.6–11.0)

## 2018-03-03 MED ORDER — DOCUSATE SODIUM 100 MG PO CAPS
200.0000 mg | ORAL_CAPSULE | Freq: Two times a day (BID) | ORAL | Status: DC
  Administered 2018-03-03 – 2018-03-04 (×2): 200 mg via ORAL
  Filled 2018-03-03 (×2): qty 2

## 2018-03-03 MED ORDER — ACETAMINOPHEN 325 MG PO TABS
650.0000 mg | ORAL_TABLET | Freq: Four times a day (QID) | ORAL | Status: DC | PRN
  Administered 2018-03-03: 650 mg via ORAL
  Filled 2018-03-03: qty 2

## 2018-03-03 MED ORDER — PANTOPRAZOLE SODIUM 40 MG PO TBEC
40.0000 mg | DELAYED_RELEASE_TABLET | Freq: Every day | ORAL | Status: DC
  Administered 2018-03-03 – 2018-03-04 (×2): 40 mg via ORAL
  Filled 2018-03-03 (×2): qty 1

## 2018-03-03 MED ORDER — ACETAMINOPHEN 650 MG RE SUPP: 650.0000 mg | Freq: Four times a day (QID) | RECTAL | Status: DC | PRN

## 2018-03-03 MED ORDER — POLYETHYLENE GLYCOL 3350 17 G PO PACK
17.0000 g | PACK | Freq: Every day | ORAL | Status: DC
  Administered 2018-03-03 – 2018-03-04 (×2): 17 g via ORAL
  Filled 2018-03-03 (×2): qty 1

## 2018-03-03 MED ORDER — ONDANSETRON HCL 4 MG/2ML IJ SOLN
4.0000 mg | Freq: Four times a day (QID) | INTRAMUSCULAR | Status: DC | PRN
  Filled 2018-03-03: qty 2

## 2018-03-03 MED ORDER — TRAZODONE HCL 50 MG PO TABS
25.0000 mg | ORAL_TABLET | Freq: Every evening | ORAL | Status: DC | PRN
  Administered 2018-03-03: 25 mg via ORAL
  Filled 2018-03-03: qty 1

## 2018-03-03 MED ORDER — OMEGA-3-ACID ETHYL ESTERS 1 G PO CAPS
1.0000 g | ORAL_CAPSULE | Freq: Every day | ORAL | Status: DC
  Administered 2018-03-03 – 2018-03-04 (×2): 1 g via ORAL
  Filled 2018-03-03 (×2): qty 1

## 2018-03-03 MED ORDER — ONDANSETRON HCL 4 MG PO TABS: 4.0000 mg | ORAL_TABLET | Freq: Four times a day (QID) | ORAL | Status: DC | PRN

## 2018-03-03 MED ORDER — TAMSULOSIN HCL 0.4 MG PO CAPS
0.4000 mg | ORAL_CAPSULE | Freq: Every day | ORAL | Status: DC
  Administered 2018-03-03 – 2018-03-04 (×2): 0.4 mg via ORAL
  Filled 2018-03-03 (×2): qty 1

## 2018-03-03 MED ORDER — CEPHALEXIN 500 MG PO CAPS: 500.0000 mg | ORAL_CAPSULE | Freq: Two times a day (BID) | ORAL | Status: DC

## 2018-03-03 MED ORDER — DORZOLAMIDE HCL-TIMOLOL MAL 2-0.5 % OP SOLN: 1.0000 [drp] | Freq: Two times a day (BID) | OPHTHALMIC | Status: DC

## 2018-03-03 MED ORDER — ENOXAPARIN SODIUM 40 MG/0.4ML ~~LOC~~ SOLN: 40.0000 mg | SUBCUTANEOUS | Status: DC

## 2018-03-03 MED ORDER — ENOXAPARIN SODIUM 30 MG/0.3ML ~~LOC~~ SOLN
30.0000 mg | SUBCUTANEOUS | Status: DC
  Administered 2018-03-03: 30 mg via SUBCUTANEOUS
  Filled 2018-03-03: qty 0.3

## 2018-03-03 MED ORDER — CEPHALEXIN 250 MG PO CAPS
250.0000 mg | ORAL_CAPSULE | Freq: Two times a day (BID) | ORAL | Status: DC
  Administered 2018-03-03 – 2018-03-04 (×3): 250 mg via ORAL
  Filled 2018-03-03 (×4): qty 1

## 2018-03-03 MED ORDER — LATANOPROST 0.005 % OP SOLN
1.0000 [drp] | Freq: Every day | OPHTHALMIC | Status: DC
  Administered 2018-03-03: 1 [drp] via OPHTHALMIC
  Filled 2018-03-03: qty 2.5

## 2018-03-03 MED ORDER — DORZOLAMIDE HCL-TIMOLOL MAL 2-0.5 % OP SOLN
1.0000 [drp] | Freq: Two times a day (BID) | OPHTHALMIC | Status: DC
  Administered 2018-03-03 – 2018-03-04 (×3): 1 [drp] via OPHTHALMIC
  Filled 2018-03-03: qty 10

## 2018-03-03 MED ORDER — FUROSEMIDE 10 MG/ML IJ SOLN
20.0000 mg | Freq: Two times a day (BID) | INTRAMUSCULAR | Status: DC
  Administered 2018-03-03 – 2018-03-04 (×3): 20 mg via INTRAVENOUS
  Filled 2018-03-03 (×3): qty 2

## 2018-03-03 NOTE — Progress Notes (Addendum)
Pt daughter states that pt was admitted in the ED last wednesday and was sent home and prescribe a Tamsulosin daily and cephalexin twice a day. Pt also request to removed (0158) her IV on her right hand due to pain. IV was removed and was intact. Will continue to monitor.   Update 0254: Doctor Marcille Blanco ordered Tamsulosin 0.4 mg oral daily. Will continue to monitor.

## 2018-03-03 NOTE — Progress Notes (Signed)
Talked to Dr. Brett Albino and request for patient to have sleeping aide tonight, as per daughter, patient's "sun down", she has dementia and confused. Order given for trazadone given. RN will continue to monitor.

## 2018-03-03 NOTE — Care Management Obs Status (Signed)
[place] NOTIFICATION   Patient Details  Name: Kelly Mcbride MRN: [id number] Date of Birth: Nov 16, 1922   Medicare Observation Status Notification Given:  Yes    Tamiya Colello A Lacosta Hargan, RN 03/03/2018, 8:10 AM

## 2018-03-03 NOTE — Plan of Care (Signed)
  Problem: Safety: Goal: Ability to remain free from injury will improve Outcome: Progressing   

## 2018-03-03 NOTE — Progress Notes (Signed)
PHARMACY NOTE:  ANTIMICROBIAL RENAL DOSAGE ADJUSTMENT  Current antimicrobial regimen includes a mismatch between antimicrobial dosage and estimated renal function.  As per policy approved by the Pharmacy & Therapeutics and Medical Executive Committees, the antimicrobial dosage will be adjusted accordingly.  Current antimicrobial dosage:  Keflex 500mg  BID Indication:  Renal Function:  Estimated Creatinine Clearance: 23.6 mL/min (A) (by C-G formula based on SCr of 1.26 mg/dL (H)). []      On intermittent HD, scheduled: []      On CRRT    Antimicrobial dosage has been changed to:  Keflex 250mg  BID Additional comments:   Thank you for allowing pharmacy to be a part of this patient's care.  Ramond Dial, Pharm.D, BCPS Clinical Pharmacist  03/03/2018 10:33 AM

## 2018-03-03 NOTE — H&P (Signed)
[place] [facility] [place] NAME: Kelly Mcbride    MR#:  [id number]  DATE OF BIRTH:  1923/02/05  DATE OF ADMISSION:  03/02/2018  PRIMARY CARE PHYSICIAN: Albina Billet, MD   REQUESTING/REFERRING PHYSICIAN: Archie Balboa, MD  CHIEF COMPLAINT:   Chief Complaint  Patient presents with  . Respiratory Distress    HISTORY OF PRESENT ILLNESS:  Kelly Mcbride  is a 84 y.o. female who presents with chief complaint as above.  Patient states that her shortness of breath has been getting progressively worse over the last several days.  Here in the ED tonight her x-rays consistent with some pulmonary edema.  She was given IV Lasix and hospitalist were called for admission  PAST MEDICAL HISTORY:   Past Medical History:  Diagnosis Date  . 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 Breast cancer ([place]) 01/10/2017  . Cardiomyopathy, secondary ([place]) 06/07/2017  . CHF (congestive heart failure) ([place]) 06/07/2017  . Incomplete emptying of bladder 01/29/2014  . Kidney cyst, acquired 01/10/2017  . Knee pain 04/28/2016  . Low back pain 09/25/2012  . Mixed urge and stress incontinence 09/25/2012  . Nephrolithiasis 01/10/2017     PAST SURGICAL HISTORY:   Past Surgical History:  Procedure Laterality Date  . APPENDECTOMY    . BREAST SURGERY    . EXTRACORPOREAL SHOCK WAVE LITHOTRIPSY Right 07/06/2017   Procedure: EXTRACORPOREAL SHOCK WAVE LITHOTRIPSY (ESWL);  Surgeon: Abbie Sons, MD;  Location: [facility] ORS;  Service: Urology;  Laterality: Right;     SOCIAL HISTORY:   Social History   Tobacco Use  . Smoking status: Never Smoker  . Smokeless tobacco: Never Used  Substance Use Topics  . Alcohol use: No     FAMILY HISTORY:  Family history reviewed and is non-contributory   DRUG ALLERGIES:   Allergies  Allergen Reactions  . Nitrofurantoin Nausea And Vomiting  . Sulfa Antibiotics Hives and Rash  . Aspirin  Other (See Comments)    Reaction: feels like she is having a heart attack/ because of hiatal hernia  . Ciprofloxacin Other (See Comments)    Other reaction(s): Other (See Comments) Per patient she get upset stomach and dizziness.   . Ibuprofen Other (See Comments)    Other reaction(s): Other (See Comments) Because of hiatal hernia   . Penicillins Other (See Comments)    Has patient had a PCN reaction causing immediate rash, facial/tongue/throat swelling, SOB or lightheadedness with hypotension: Unknown Has patient had a PCN reaction causing severe rash involving mucus membranes or skin necrosis: Unknown Has patient had a PCN reaction that required hospitalization: Unknown Has patient had a PCN reaction occurring within the last 10 years: Unknown If all of the above answers are "NO", then may proceed with Cephalosporin use.     MEDICATIONS AT HOME:   Prior to Admission medications   Medication Sig Start Date End Date Taking? Authorizing Provider  metroNIDAZOLE (METROCREAM) 0.75 % cream Apply 1 application topically daily. Apply to face 01/15/18  Yes [provider]  acetaminophen (TYLENOL) 500 MG tablet Take 1,000 mg by mouth daily as needed for mild pain.     [provider]  Ascorbic Acid (VITAMIN C) 1000 MG tablet Take 1,000 mg by mouth. 09/25/12   [provider]  bimatoprost (LUMIGAN) 0.01 % SOLN Place 1 drop into both eyes at bedtime.     [provider]  cephALEXin (KEFLEX) 500 MG capsule  Take 1 capsule (500 mg total) by mouth 2 (two) times daily for 10 days. 02/28/18 03/10/18  Schaevitz, Randall An, MD  docusate sodium (COLACE) 100 MG capsule Take 100 mg by mouth 2 (two) times daily as needed for mild constipation.     [provider]  dorzolamide-timolol (COSOPT) 22.3-6.8 MG/ML ophthalmic solution Place 1 drop into both eyes 2 (two) times daily.  06/07/17   [provider]  doxycycline (VIBRAMYCIN) 100 MG capsule Take 1 capsule  (100 mg total) by mouth every 12 (twelve) hours. Patient not taking: Reported on 03/02/2018 01/01/18   Abbie Sons, MD  feeding supplement, ENSURE ENLIVE, (ENSURE ENLIVE) LIQD Take 237 mLs by mouth 2 (two) times daily between meals. 11/09/17   Loletha Grayer, MD  furosemide (LASIX) 20 MG tablet  12/14/17   [provider]  levofloxacin (LEVAQUIN) 500 MG tablet Take 1 tablet (500 mg total) by mouth daily. Patient not taking: Reported on 03/02/2018 01/24/18   [name] A, PA-C  Multiple Vitamin (MULTIVITAMIN WITH MINERALS) TABS tablet Take 1 tablet by mouth daily. 11/09/17   Loletha Grayer, MD  Omega-3 Fatty Acids (FISH OIL PO) Take 1 capsule by mouth daily.     [provider]  Omega-3-6-9 CAPS Take by mouth.    [provider]  omeprazole (PRILOSEC) 20 MG capsule Take 20 mg by mouth daily.  11/01/13   [provider]  phenazopyridine (PYRIDIUM) 200 MG tablet Take by mouth. 11/14/17   [provider]  potassium chloride (K-DUR) 10 MEQ tablet Take by mouth. 11/14/17 11/14/18  [provider]  potassium chloride (K-DUR,KLOR-CON) 10 MEQ tablet Take 10 mEq by mouth daily. 02/13/18   [provider]  PREMARIN vaginal cream Place 1 application vaginally. 02/21/18   [provider]  tamsulosin (FLOMAX) 0.4 MG CAPS capsule Take 1 capsule (0.4 mg total) by mouth daily. 02/28/18   Schaevitz, Randall An, MD  vitamin E 400 UNIT capsule Take by mouth.    [provider]    REVIEW OF SYSTEMS:  Review of Systems  Constitutional: Negative for chills, fever, malaise/fatigue and weight loss.  HENT: Negative for ear pain, hearing loss and tinnitus.   Eyes: Negative for blurred vision, double vision, pain and redness.  Respiratory: Positive for shortness of breath. Negative for cough and hemoptysis.   Cardiovascular: Positive for orthopnea. Negative for chest pain and palpitations.  Gastrointestinal: Negative for abdominal pain,  constipation, diarrhea, nausea and vomiting.  Genitourinary: Negative for dysuria, frequency and hematuria.  Musculoskeletal: Negative for back pain, joint pain and neck pain.  Skin:       No acne, rash, or lesions  Neurological: Negative for dizziness, tremors, focal weakness and weakness.  Endo/Heme/Allergies: Negative for polydipsia. Does not bruise/bleed easily.  Psychiatric/Behavioral: Negative for depression. The patient is not nervous/anxious and does not have insomnia.      VITAL SIGNS:   Vitals:   03/02/18 2013 03/02/18 2014 03/02/18 2016 03/02/18 2230  BP:  (!) 159/103    Pulse:  (!) 108  (!) 104  Resp:  (!) 21  (!) 24  TempSrc:  Oral    SpO2: 100% 100%  99%  Weight:   56.7 kg    Wt Readings from Last 3 Encounters:  03/02/18 56.7 kg  02/28/18 56.7 kg  01/24/18 56.2 kg    PHYSICAL EXAMINATION:  Physical Exam  Vitals reviewed. Constitutional: She is oriented to person, place, and time. She appears well-developed and well-nourished. No distress.  HENT:  Head: Normocephalic and atraumatic.  Mouth/Throat: Oropharynx is clear and moist.  Eyes: Pupils are equal, round, and reactive to light. Conjunctivae and EOM are normal. No scleral icterus.  Neck: Normal range of motion. Neck supple. No JVD present. No thyromegaly present.  Cardiovascular: Normal rate, regular rhythm and intact distal pulses. Exam reveals no gallop and no friction rub.  No murmur heard. Respiratory: Effort normal. No respiratory distress. She has no wheezes. She has rales.  GI: Soft. Bowel sounds are normal. She exhibits no distension. There is no tenderness.  Musculoskeletal: Normal range of motion. She exhibits no edema.  No arthritis, no gout  Lymphadenopathy:    She has no cervical adenopathy.  Neurological: She is alert and oriented to person, place, and time. No cranial nerve deficit.  No dysarthria, no aphasia  Skin: Skin is warm and dry. No rash noted. No erythema.  Psychiatric: She has a  normal mood and affect. Her behavior is normal. Judgment and thought content normal.    LABORATORY PANEL:   CBC Recent Labs  Lab 03/02/18 2020  WBC 8.2  HGB 15.9  HCT 47.1*  PLT 207   ------------------------------------------------------------------------------------------------------------------  Chemistries  Recent Labs  Lab 02/28/18 0957 03/02/18 2020  NA 144 142  K 4.3 4.4  CL 108 106  CO2 31 29  GLUCOSE 89 120*  BUN 23 22  CREATININE 1.26* 1.16*  CALCIUM 10.1 9.9  AST 24  --   ALT 13  --   ALKPHOS 78  --   BILITOT 0.8  --    ------------------------------------------------------------------------------------------------------------------  Cardiac Enzymes Recent Labs  Lab 03/02/18 2020  TROPONINI <0.03   ------------------------------------------------------------------------------------------------------------------  RADIOLOGY:  Dg Chest 2 View  Result Date: 03/02/2018 CLINICAL DATA:  Dyspnea x4 hours EXAM: CHEST - 2 VIEW COMPARISON:  CT abdomen and pelvis 02/28/2018, CXR 02/28/2018 and 11/07/2017 FINDINGS: Posterior diaphragmatic eventration versus chronic large hiatal hernia containing stomach and large bowel loops projecting over the lung base the frontal view. This is similar appearance to 11/07/2017 radiographs. There is atelectasis at the lung bases. There is mild interstitial edema. No pneumothorax. No acute osseous abnormality. Cardiac silhouette is top-normal in size. There is aortic atherosclerosis at the arch. IMPRESSION: Diaphragmatic eventration versus large chronic hiatal hernia projects over the lung bases with bibasilar atelectasis and mild interstitial edema. Aortic atherosclerosis. Electronically Signed   By: Ashley Royalty M.D.   On: 03/02/2018 21:04    EKG:   Orders placed or performed during the hospital encounter of 03/02/18  . EKG 12-Lead  . EKG 12-Lead    IMPRESSION AND PLAN:  Principal Problem:   Acute on chronic diastolic CHF  (congestive heart failure) (HCC) -IV Lasix given in the ED with good urine output so far.  We can give another dose if needed later on in the morning, continue other home meds. Active Problems:   GERD (gastroesophageal reflux disease) -Home dose PPI   HLD (hyperlipidemia) -Home dose antilipid   Chart review performed and case discussed with ED provider. Labs, imaging and/or ECG reviewed by provider and discussed with patient/family. Management plans discussed with the patient and/or family.  DVT PROPHYLAXIS: SubQ lovenox   GI PROPHYLAXIS:  PPI   ADMISSION STATUS: Observation  CODE STATUS: DNR Code Status History    Date Active Date Inactive Code Status Order ID Comments User Context   11/08/2017 1016 11/09/2017 1829 DNR [id number]  Loletha Grayer, MD Inpatient   11/07/2017 2231 11/08/2017 1016 Full Code [id number]  Amelia Jo, MD ED  Questions for Most Recent Historical Code Status (Order [id number])    Question Answer Comment   In the event of cardiac or respiratory ARREST Do not call a "code blue"    In the event of cardiac or respiratory ARREST Do not perform Intubation, CPR, defibrillation or ACLS    In the event of cardiac or respiratory ARREST Use medication by any route, position, wound care, and other measures to relive pain and suffering. May use oxygen, suction and manual treatment of airway obstruction as needed for comfort.    Comments nurse may pronounce       TOTAL TIME TAKING CARE OF THIS PATIENT: 40 minutes.   Jannifer Franklin, Zitlali Primm Sarpy 03/03/2018, 12:40 AM  [employer]  [phone]  CC: Primary care physician; Albina Billet, MD  Note:  This document was prepared using Dragon voice recognition software and may include unintentional dictation errors.

## 2018-03-03 NOTE — Progress Notes (Signed)
[place] at [facility]                                                                                                                                                                                  Patient Demographics   Kelly Mcbride, is a 84 y.o. female, DOB - Apr 25, 1923, WYO:[id number]  Admit date - 03/02/2018   Admitting Physician Lance Coon, MD  Outpatient Primary MD for the patient is Albina Billet, MD   LOS - 0  Subjective: Patient admitted with shortness of breath recently seen in the ED for renal stone and hydronephrosis at that time was given IV fluids. Patient also had abnormal swelling in her uterus patient has outpatient ultrasound schedule however daughter would like to have this done currently.  Review of Systems:   CONSTITUTIONAL: No documented fever. No fatigue, weakness. No weight gain, no weight loss.  EYES: No blurry or double vision.  ENT: No tinnitus. No postnasal drip. No redness of the oropharynx.  RESPIRATORY: No cough, no wheeze, no hemoptysis.  Positive dyspnea.  CARDIOVASCULAR: No chest pain. No orthopnea. No palpitations. No syncope.  GASTROINTESTINAL: No nausea, no vomiting or diarrhea. No abdominal pain. No melena or hematochezia.  GENITOURINARY: No dysuria or hematuria.  ENDOCRINE: No polyuria or nocturia. No heat or cold intolerance.  HEMATOLOGY: No anemia. No bruising. No bleeding.  INTEGUMENTARY: No rashes. No lesions.  MUSCULOSKELETAL: No arthritis. No swelling. No gout.  NEUROLOGIC: No numbness, tingling, or ataxia. No seizure-type activity.  PSYCHIATRIC: No anxiety. No insomnia. No ADD.    Vitals:   Vitals:   03/02/18 2230 03/03/18 0155 03/03/18 0745 03/03/18 0829  BP:  110/66 116/71 (!) 113/57  Pulse: (!) 104 91 80 81  Resp: (!) 24 18    Temp:  (!) 97.5 F (36.4 C)  97.6 F (36.4 C)  TempSrc:  Oral  Oral  SpO2: 99% 97% 100% 100%  Weight:  56.3 kg      Wt Readings from Last 3 Encounters:  03/03/18 56.3  kg  02/28/18 56.7 kg  01/24/18 56.2 kg     Intake/Output Summary (Last 24 hours) at 03/03/2018 1400 Last data filed at 03/03/2018 0319 Gross per 24 hour  Intake -  Output 200 ml  Net -200 ml    Physical Exam:   GENERAL: Pleasant-appearing in no apparent distress.  HEAD, EYES, EARS, NOSE AND THROAT: Atraumatic, normocephalic. Extraocular muscles are intact. Pupils equal and reactive to light. Sclerae anicteric. No conjunctival injection. No oro-pharyngeal erythema.  NECK: Supple. There is no jugular venous distention. No bruits, no lymphadenopathy, no thyromegaly.  HEART: Regular rate and rhythm,. No murmurs, no rubs, no clicks.  LUNGS: Bilateral crackles at the bases  aBDOMEN: Soft, flat, nontender, nondistended. Has good bowel sounds. No hepatosplenomegaly appreciated.  EXTREMITIES: No evidence of any cyanosis, clubbing, or peripheral edema.  +2 pedal and radial pulses bilaterally.  NEUROLOGIC: The patient is alert, awake, and oriented x3 with no focal motor or sensory deficits appreciated bilaterally.  SKIN: Moist and warm with no rashes appreciated.  Psych: Not anxious, depressed LN: No inguinal LN enlargement    Antibiotics   Anti-infectives (From admission, onward)   Start     Dose/Rate Route Frequency Ordered Stop   03/03/18 1045  cephALEXin (KEFLEX) capsule 500 mg  Status:  Discontinued     500 mg Oral Every 12 hours 03/03/18 1031 03/03/18 1033   03/03/18 1045  cephALEXin (KEFLEX) capsule 250 mg     250 mg Oral Every 12 hours 03/03/18 1033        Medications   Scheduled Meds: . cephALEXin  250 mg Oral Q12H  . dorzolamide-timolol  1 drop Right Eye BID  . enoxaparin (LOVENOX) injection  30 mg Subcutaneous Q24H  . furosemide  20 mg Intravenous Q12H  . latanoprost  1 drop Both Eyes QHS  . omega-3 acid ethyl esters  1 g Oral Daily  . pantoprazole  40 mg Oral Daily  . tamsulosin  0.4 mg Oral Daily   Continuous Infusions: PRN Meds:.acetaminophen **OR**  acetaminophen, ondansetron **OR** ondansetron (ZOFRAN) IV   Data Review:   Micro Results Recent Results (from the past 240 hour(s))  Urine Culture     Status: None   Collection Time: 02/28/18  9:57 AM  Result Value Ref Range Status   Specimen Description   Final    URINE, RANDOM Performed at [facility], [address], [place], [place] [address]    Special Requests   Final    NONE Performed at [facility], [address], [place], [place] [address]    Culture   Final    NO GROWTH Performed at [facility], [place] [address] [place], [place] [address]    Report Status 03/01/2018 FINAL  Final    Radiology Reports Dg Chest 1 View  Result Date: 02/28/2018 CLINICAL DATA:  Shortness of breath. EXAM: CHEST  1 VIEW COMPARISON:  Radiographs dated 11/09/2017 and 11/07/2017 and CT scan of the abdomen 02/22/2011 FINDINGS: Patient has a chronic massive hiatal hernia which contains the stomach and transverse colon. Heart size and pulmonary vascularity are normal. Chronic compressive atelectasis at the lung bases. Diffuse osteopenia. Multiple surgical clips in the left breast and left axilla. IMPRESSION: No acute abnormalities.  Massive chronic hiatal hernia. Electronically Signed   By: Lorriane Shire M.D.   On: 02/28/2018 10:36   Dg Chest 2 View  Result Date: 03/02/2018 CLINICAL DATA:  Dyspnea x4 hours EXAM: CHEST - 2 VIEW COMPARISON:  CT abdomen and pelvis 02/28/2018, CXR 02/28/2018 and 11/07/2017 FINDINGS: Posterior diaphragmatic eventration versus chronic large hiatal hernia containing stomach and large bowel loops projecting over the lung base the frontal view. This is similar appearance to 11/07/2017 radiographs. There is atelectasis at the lung bases. There is mild interstitial edema. No pneumothorax. No acute osseous abnormality. Cardiac silhouette is top-normal in size. There is aortic atherosclerosis at the arch. IMPRESSION: Diaphragmatic eventration versus large  chronic hiatal hernia projects over the lung bases with bibasilar atelectasis and mild interstitial edema. Aortic atherosclerosis. Electronically Signed   By: Ashley Royalty M.D.   On: 03/02/2018 21:04   US Pelvis (transabdominal  Only)  Result Date: 03/03/2018 CLINICAL DATA:  Fluid in the endometrial cavity of a bicornuate uterus on recent CT. EXAM: TRANSABDOMINAL ULTRASOUND OF PELVIS TECHNIQUE: Transabdominal ultrasound examination of the pelvis was performed including evaluation of the uterus, ovaries, adnexal regions, and pelvic cul-de-sac. COMPARISON:  Abdomen and pelvis CT dated 02/28/2018. FINDINGS: Uterus Measurements: 7.4 x 4.2 x 2.9 cm. Multiple small myometrial calcifications with no discrete mass visualized. The uterine anatomy is also suboptimally visualized, better seen on the recent CT. Endometrium Thickness: 14.5 mm. No fluid in the endometrial cavity and no internal blood flow seen with color Doppler. Right ovary Not visualized, small and normal on the recent CT. Left ovary Not visualized, small and normal on the recent CT. Other findings:  No abnormal free fluid. IMPRESSION: 1. Suboptimal examination due to the lack of a transvaginal component without adequate delineation of the uterine anatomy. 2. Abnormally thickened endometrial stripe. This can be seen with endometrial carcinoma. 3. Nonvisualized ovaries, small and normal on the recent CT. Electronically Signed   By: Claudie Revering M.D.   On: 03/03/2018 13:07   Ct Abdomen Pelvis W Contrast  Result Date: 02/28/2018 CLINICAL DATA:  Pelvic pain for several months. EXAM: CT ABDOMEN AND PELVIS WITH CONTRAST TECHNIQUE: Multidetector CT imaging of the abdomen and pelvis was performed using the standard protocol following bolus administration of intravenous contrast. CONTRAST:  61mL OMNIPAQUE IOHEXOL 300 MG/ML  SOLN COMPARISON:  None. FINDINGS: Lower chest: There is minimal pericardial effusion. Bilateral small pleural effusions are noted. The heart  size is normal. Hepatobiliary: No focal liver abnormality is seen. No gallstones, gallbladder wall thickening, or biliary dilatation. Pancreas: Unremarkable. No pancreatic ductal dilatation or surrounding inflammatory changes. Spleen: Normal in size without focal abnormality. Adrenals/Urinary Tract: There is a 2.8 cm low-density lesion in the right adrenal gland. This is probably an adrenal adenoma. Bilateral kidney cysts are identified. Bilateral nonobstructing kidney stones are noted. There is mild left hydronephrosis due to obstruction by a 3.4 mm stone in the distal left ureter image 58. There is no right hydronephrosis. Bladder diverticulum are noted. Stomach/Bowel: Large posterior herniation stomach small bowel loops into the posterior lower chest are noted. There is no evidence of bowel obstruction. There is probably a hiatal hernia. The appendix is surgically removed. There is no diverticulitis. Vascular/Lymphatic: Aortic atherosclerosis. No enlarged abdominal or pelvic lymph nodes. 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 There is fluid in the endometrial cavity, abnormal for patient's age. No masses identified in bilateral adnexa. Other: Bilateral inguinal herniation of mesenteric fat is identified. Musculoskeletal: Degenerative joint changes of the spine are noted. IMPRESSION: Mild left hydronephrosis due to obstruction by 3.4 mm stone in the distal the ureter. Nonobstructing stones are noted in bilateral kidneys. Bilateral kidney cysts are identified. 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 There is fluid in the endometrial cavity, abnormal for the patient's age. Recommend further evaluation with pelvic ultrasound on outpatient basis. Electronically Signed   By: Abelardo Diesel M.D.   On: 02/28/2018 11:03     CBC Recent Labs  Lab 02/28/18 0957 03/02/18 2020 03/03/18 0205  WBC 6.5 8.2 7.3  HGB 14.7 15.9 14.7  HCT 43.1 47.1* 42.9  PLT 207 207 201  MCV 86.9 87.2 86.6  MCH 29.6 29.4 29.6  MCHC  34.1 33.8 34.2  RDW 13.7 13.6 13.4  LYMPHSABS 1.4 1.4  --   MONOABS 0.5 0.7  --   EOSABS 0.1 0.2  --   BASOSABS 0.1 0.1  --     Chemistries  Recent Labs  Lab 02/28/18 0957 03/02/18 2020 03/03/18 0205  NA 144 142 144  K 4.3 4.4 4.1  CL 108 106 107  CO2 31 29 31   GLUCOSE 89 120* 116*  BUN 23 22 22   CREATININE 1.26* 1.16* 1.26*  CALCIUM 10.1 9.9 9.4  AST 24  --   --   ALT 13  --   --   ALKPHOS 78  --   --   BILITOT 0.8  --   --    ------------------------------------------------------------------------------------------------------------------ estimated creatinine clearance is 23.6 mL/min (A) (by C-G formula based on SCr of 1.26 mg/dL (H)). ------------------------------------------------------------------------------------------------------------------ No results for input(s): HGBA1C in the last 72 hours. ------------------------------------------------------------------------------------------------------------------ No results for input(s): CHOL, HDL, LDLCALC, TRIG, CHOLHDL, LDLDIRECT in the last 72 hours. ------------------------------------------------------------------------------------------------------------------ No results for input(s): TSH, T4TOTAL, T3FREE, THYROIDAB in the last 72 hours.  Invalid input(s): FREET3 ------------------------------------------------------------------------------------------------------------------ No results for input(s): VITAMINB12, FOLATE, FERRITIN, TIBC, IRON, RETICCTPCT in the last 72 hours.  Coagulation profile No results for input(s): INR, PROTIME in the last 168 hours.  No results for input(s): DDIMER in the last 72 hours.  Cardiac Enzymes Recent Labs  Lab 02/28/18 0957 03/02/18 2020  TROPONINI <0.03 <0.03   ------------------------------------------------------------------------------------------------------------------ Invalid input(s): POCBNP    Assessment & Plan  Patient is 84 year old admitted with shortness  of breath   1. Acute on chronic diastolic CHF (congestive heart failure) (HCC) -IV Lasix given in the ED with good urine output so far.    I will place patient on IV Lasix  2.  GERD (gastroesophageal reflux disease) -Home dose PPI  3.  HLD (hyperlipidemia) -continue Home dose antilipid  4.  Abnormal uterus will order pelvic ultrasound  5.  Hydronephrosis with stone outpatient urology follow-up     Code Status Orders  (From admission, onward)         Start     Ordered   03/03/18 0146  Do not attempt resuscitation (DNR)  Continuous    Question Answer Comment  In the event of cardiac or respiratory ARREST Do not call a "code blue"   In the event of cardiac or respiratory ARREST Do not perform Intubation, CPR, defibrillation or ACLS   In the event of cardiac or respiratory ARREST Use medication by any route, position, wound care, and other measures to relive pain and suffering. May use oxygen, suction and manual treatment of airway obstruction as needed for comfort.   Comments nurse may pronounce      03/03/18 0147        Code Status History    Date Active Date Inactive Code Status Order ID Comments User Context   11/08/2017 1016 11/09/2017 1829 DNR [id number]  Loletha Grayer, MD Inpatient   11/07/2017 2231 11/08/2017 1016 Full Code [id number]  Amelia Jo, MD ED    Advance Directive Documentation     Most Recent Value  Type of Advance Directive  Living will, Healthcare Power of Attorney  Pre-existing out of facility DNR order (yellow form or pink MOST form)  -  "MOST" Form in Place?  -           Consults none  DVT Prophylaxis  Lovenox   Lab Results  Component Value Date   PLT 201 03/03/2018     Time Spent in minutes  57min  Greater than 50% of time spent in care coordination and counseling patient regarding the condition and plan of care.   Dustin Flock M.D on 03/03/2018 at 2:00 PM  Between 7am  to 6pm - Pager - [phone]  After 6pm go to  [email or web address] - Proofreader  [employer]   Office  [phone]

## 2018-03-03 NOTE — Progress Notes (Signed)
Kelly Mcbride was notified by peer, that her grandmother was being admitted to [facility] on 03/02/2018. The [facility] reached back out to his peer and asked if he could call on her GM, who was admitted on 03/02/2018 due to CHF. Blessings by the peer were given to call on her GM, the [facility] presented to the room, knocked on the door and identified myself as the [facility]. I was welcomed in pastoral presence ensued. I introduced myself and was cordially welcomed by the patient and her daughter who is my peers' mother. We casually spoke of my peer, the family and, many other topics, both spiritual and non-spiritual. Kelly Mcbride was optimistic that she would be getting d/c home tomorrow. She was lying in the bed comfortable without any use of accessory muscles. She requested prayers, requesting such and we commenced to hold hands and pray. I conferred blessings upon her and the family, in addition to acknowledging the health concerns of my peer and the hope for a adequately termed pregnancy and healthy baby.   03/03/18 1600  Clinical Encounter Type  Visited With Patient and family together  Visit Type Initial  Referral From Physician  Consult/Referral To Chaplain  Spiritual Encounters  Spiritual Needs Prayer;Emotional  Stress Factors  Patient Stress Factors Health changes  Family Stress Factors Health changes

## 2018-03-04 MED ORDER — FUROSEMIDE 20 MG PO TABS: 20.0000 mg | ORAL_TABLET | Freq: Two times a day (BID) | ORAL | 0 refills | Status: DC

## 2018-03-04 NOTE — Care Management Note (Signed)
Case Management Note  Patient Details  Name: Kelly Mcbride MRN: [id number] Date of Birth: April 18, 1923  Subjective/Objective:  Patient to be discharged per MD order. Orders in place for home health services. Patients daughter Olivia Mackie [phone] at bedside and tells me that they used [facility] in the past and would like to use them again. Referral placed with Jermaine who agrees to accept the patient for PT and aide services. Hospital bed, wheelchair,walker and bedside commode all in home. Family to provide transport.  Ines Bloomer RN BSN RNCM [phone]                     Action/Plan:   Expected Discharge Date:  03/04/18               Expected Discharge Plan:  [place]  In-House Referral:     Discharge planning Services  CM Consult  Post Acute Care Choice:  Home Health Choice offered to:  Patient, Adult Children  DME Arranged:    DME Agency:     HH Arranged:  PT, Nurse's Aide [place] Agency:  [place]  Status of Service:  Completed, signed off  If discussed at [place] of Stay Meetings, dates discussed:    Additional Comments:  Latanya Maudlin, RN 03/04/2018, 11:35 AM

## 2018-03-04 NOTE — Discharge Summary (Signed)
[place] at [facility], [place] y.o[place] DOB August 11, 1922, MRN [id number]. Admission date: 03/02/2018 Discharge Date 03/04/2018 Primary MD Albina Billet, MD Admitting Physician Lance Coon, MD  Admission Diagnosis  Acute on chronic congestive heart failure, unspecified heart failure type [facility]) [I50.9]  Discharge Diagnosis   Principal Problem: Acute on chronic diastolic CHF (congestive heart failure) (HCC) GERD (gastroesophageal reflux disease) HLD (hyperlipidemia) Endometrial wall thickening Hydronephrosis with stone outpatient urology follow-up       Hospital Course Patient is a 84 year old who presented with complaint of shortness of breath progressively worse.  Patient was recently seen in the ER for renal stone.  Patient was noted to have acute CHF she was admitted and given IV Lasix with the strict treatments she is doing much better.  Patient also had some fluid noted in her uterus during her previous evaluation.  Therefore daughter requested a pelvic ultrasound which showed endometrial wall thickening.  Patient currently back to her baseline doing well and stable for discharge.             Consults  None  Significant Tests:  See full reports for all details     Dg Chest 1 View  Result Date: 02/28/2018 CLINICAL DATA:  Shortness of breath. EXAM: CHEST  1 VIEW COMPARISON:  Radiographs dated 11/09/2017 and 11/07/2017 and CT scan of the abdomen 02/22/2011 FINDINGS: Patient has a chronic massive hiatal hernia which contains the stomach and transverse colon. Heart size and pulmonary vascularity are normal. Chronic compressive atelectasis at the lung bases. Diffuse osteopenia. Multiple surgical clips in the left breast and left axilla. IMPRESSION: No acute abnormalities.  Massive chronic hiatal hernia. Electronically Signed   By: Lorriane Shire M.D.   On: 02/28/2018 10:36   Dg Chest 2 View  Result Date: 03/02/2018 CLINICAL DATA:  Dyspnea x4  hours EXAM: CHEST - 2 VIEW COMPARISON:  CT abdomen and pelvis 02/28/2018, CXR 02/28/2018 and 11/07/2017 FINDINGS: Posterior diaphragmatic eventration versus chronic large hiatal hernia containing stomach and large bowel loops projecting over the lung base the frontal view. This is similar appearance to 11/07/2017 radiographs. There is atelectasis at the lung bases. There is mild interstitial edema. No pneumothorax. No acute osseous abnormality. Cardiac silhouette is top-normal in size. There is aortic atherosclerosis at the arch. IMPRESSION: Diaphragmatic eventration versus large chronic hiatal hernia projects over the lung bases with bibasilar atelectasis and mild interstitial edema. Aortic atherosclerosis. Electronically Signed   By: Ashley Royalty M.D.   On: 03/02/2018 21:04   US Pelvis (transabdominal Only)  Result Date: 03/03/2018 CLINICAL DATA:  Fluid in the endometrial cavity of a bicornuate uterus on recent CT. EXAM: TRANSABDOMINAL ULTRASOUND OF PELVIS TECHNIQUE: Transabdominal ultrasound examination of the pelvis was performed including evaluation of the uterus, ovaries, adnexal regions, and pelvic cul-de-sac. COMPARISON:  Abdomen and pelvis CT dated 02/28/2018. FINDINGS: Uterus Measurements: 7.4 x 4.2 x 2.9 cm. Multiple small myometrial calcifications with no discrete mass visualized. The uterine anatomy is also suboptimally visualized, better seen on the recent CT. Endometrium Thickness: 14.5 mm. No fluid in the endometrial cavity and no internal blood flow seen with color Doppler. Right ovary Not visualized, small and normal on the recent CT. Left ovary Not visualized, small and normal on the recent CT. Other findings:  No abnormal free fluid. IMPRESSION: 1. Suboptimal examination due to the lack of a transvaginal component without adequate delineation of the uterine anatomy. 2. Abnormally thickened endometrial stripe. This can be seen with  endometrial carcinoma. 3. Nonvisualized ovaries, small and  normal on the recent CT. Electronically Signed   By: Claudie Revering M.D.   On: 03/03/2018 13:07   Ct Abdomen Pelvis W Contrast  Result Date: 02/28/2018 CLINICAL DATA:  Pelvic pain for several months. EXAM: CT ABDOMEN AND PELVIS WITH CONTRAST TECHNIQUE: Multidetector CT imaging of the abdomen and pelvis was performed using the standard protocol following bolus administration of intravenous contrast. CONTRAST:  72mL OMNIPAQUE IOHEXOL 300 MG/ML  SOLN COMPARISON:  None. FINDINGS: Lower chest: There is minimal pericardial effusion. Bilateral small pleural effusions are noted. The heart size is normal. Hepatobiliary: No focal liver abnormality is seen. No gallstones, gallbladder wall thickening, or biliary dilatation. Pancreas: Unremarkable. No pancreatic ductal dilatation or surrounding inflammatory changes. Spleen: Normal in size without focal abnormality. Adrenals/Urinary Tract: There is a 2.8 cm low-density lesion in the right adrenal gland. This is probably an adrenal adenoma. Bilateral kidney cysts are identified. Bilateral nonobstructing kidney stones are noted. There is mild left hydronephrosis due to obstruction by a 3.4 mm stone in the distal left ureter image 58. There is no right hydronephrosis. Bladder diverticulum are noted. Stomach/Bowel: Large posterior herniation stomach small bowel loops into the posterior lower chest are noted. There is no evidence of bowel obstruction. There is probably a hiatal hernia. The appendix is surgically removed. There is no diverticulitis. Vascular/Lymphatic: Aortic atherosclerosis. No enlarged abdominal or pelvic lymph nodes. 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 There is fluid in the endometrial cavity, abnormal for patient's age. No masses identified in bilateral adnexa. Other: Bilateral inguinal herniation of mesenteric fat is identified. Musculoskeletal: Degenerative joint changes of the spine are noted. IMPRESSION: Mild left hydronephrosis due to obstruction  by 3.4 mm stone in the distal the ureter. Nonobstructing stones are noted in bilateral kidneys. Bilateral kidney cysts are identified. 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 There is fluid in the endometrial cavity, abnormal for the patient's age. Recommend further evaluation with pelvic ultrasound on outpatient basis. Electronically Signed   By: Abelardo Diesel M.D.   On: 02/28/2018 11:03       Today   Subjective:   Kelly Mcbride patient denies any complaints or chest pain Objective:   Blood pressure 116/72, pulse (!) 106, temperature (!) 97.5 F (36.4 C), temperature source Oral, resp. rate 17, weight 56.3 kg, SpO2 95 %.  .  Intake/Output Summary (Last 24 hours) at 03/04/2018 1459 Last data filed at 03/04/2018 1010 Gross per 24 hour  Intake 240 ml  Output 1600 ml  Net -1360 ml    Exam VITAL SIGNS: Blood pressure 116/72, pulse (!) 106, temperature (!) 97.5 F (36.4 C), temperature source Oral, resp. rate 17, weight 56.3 kg, SpO2 95 %.  GENERAL:  84 y.o.-year-old patient lying in the bed with no acute distress.  EYES: Pupils equal, round, reactive to light and accommodation. No scleral icterus. Extraocular muscles intact.  HEENT: Head atraumatic, normocephalic. Oropharynx and nasopharynx clear.  NECK:  Supple, no jugular venous distention. No thyroid enlargement, no tenderness.  LUNGS: Normal breath sounds bilaterally, no wheezing, rales,rhonchi or crepitation. No use of accessory muscles of respiration.  CARDIOVASCULAR: S1, S2 normal. No murmurs, rubs, or gallops.  ABDOMEN: Soft, nontender, nondistended. Bowel sounds present. No organomegaly or mass.  EXTREMITIES: No pedal edema, cyanosis, or clubbing.  NEUROLOGIC: Cranial nerves II through XII are intact. Muscle strength 5/5 in all extremities. Sensation intact. Gait not checked.  PSYCHIATRIC: The patient is alert and oriented x 3.  SKIN: No obvious rash, lesion, or  ulcer.   Data Review     CBC w Diff:  Lab Results  Component  Value Date   WBC 7.3 03/03/2018   HGB 14.7 03/03/2018   HGB 15.3 09/18/2012   HCT 42.9 03/03/2018   HCT 45.8 09/18/2012   PLT 201 03/03/2018   PLT 239 09/18/2012   LYMPHOPCT 18 03/02/2018   LYMPHOPCT 30.1 04/18/2012   MONOPCT 8 03/02/2018   MONOPCT 6.2 04/18/2012   EOSPCT 3 03/02/2018   EOSPCT 1.4 04/18/2012   BASOPCT 1 03/02/2018   BASOPCT 1.3 04/18/2012   CMP:  Lab Results  Component Value Date   NA 144 03/03/2018   NA 141 09/18/2012   K 4.1 03/03/2018   K 4.1 11/18/2013   CL 107 03/03/2018   CL 107 09/18/2012   CO2 31 03/03/2018   CO2 25 09/18/2012   BUN 22 03/03/2018   BUN 14 09/18/2012   CREATININE 1.26 (H) 03/03/2018   CREATININE 0.80 09/18/2012   PROT 6.1 (L) 02/28/2018   PROT 7.1 04/18/2012   ALBUMIN 3.0 (L) 02/28/2018   ALBUMIN 3.5 04/18/2012   BILITOT 0.8 02/28/2018   BILITOT 0.4 04/18/2012   ALKPHOS 78 02/28/2018   ALKPHOS 108 04/18/2012   AST 24 02/28/2018   AST 23 04/18/2012   ALT 13 02/28/2018   ALT 25 04/18/2012  .  Micro Results Recent Results (from the past 240 hour(s))  Urine Culture     Status: None   Collection Time: 02/28/18  9:57 AM  Result Value Ref Range Status   Specimen Description   Final    URINE, RANDOM Performed at [facility], [address] [place], [place] [address]    Special Requests   Final    NONE Performed at [facility], [address] [place], [place] [address]    Culture   Final    NO GROWTH Performed at [facility], [place] [address] [place], [place] [address]    Report Status 03/01/2018 FINAL  Final        Code Status Orders  (From admission, onward)         Start     Ordered   03/03/18 0146  Do not attempt resuscitation (DNR)  Continuous    Question Answer Comment  In the event of cardiac or respiratory ARREST Do not call a "code blue"   In the event of cardiac or respiratory ARREST Do not perform Intubation, CPR, defibrillation or ACLS   In the event of cardiac or  respiratory ARREST Use medication by any route, position, wound care, and other measures to relive pain and suffering. May use oxygen, suction and manual treatment of airway obstruction as needed for comfort.   Comments nurse may pronounce      03/03/18 0147        Code Status History    Date Active Date Inactive Code Status Order ID Comments User Context   11/08/2017 1016 11/09/2017 1829 DNR [id number]  Loletha Grayer, MD Inpatient   11/07/2017 2231 11/08/2017 1016 Full Code [id number]  Amelia Jo, MD ED    Advance Directive Documentation     Most Recent Value  Type of Advance Directive  Living will, Healthcare Power of Attorney  Pre-existing out of facility DNR order (yellow form or pink MOST form)  -  "MOST" Form in Place?  -          Follow-up Information    [facility] Follow up on  03/13/2018.   Specialty:  Cardiology Why:  at 11:40am Contact information: [place] [address]       Albina Billet, MD Follow up in 6 day(s).   Specialty:  Internal Medicine Contact information: [address] [place] [address] [phone]           Discharge Medications   Allergies as of 03/04/2018      Reactions   Nitrofurantoin Nausea And Vomiting   Sulfa Antibiotics Hives, Rash   Aspirin Other (See Comments)   Reaction: feels like she is having a heart attack/ because of hiatal hernia   Ciprofloxacin Other (See Comments)   Other reaction(s): Other (See Comments) Per patient she get upset stomach and dizziness.    Ibuprofen Other (See Comments)   Other reaction(s): Other (See Comments) Because of hiatal hernia    Penicillins Other (See Comments)   Has patient had a PCN reaction causing immediate rash, facial/tongue/throat swelling, SOB or lightheadedness with hypotension: Unknown Has patient had a PCN reaction causing severe rash involving mucus membranes or  skin necrosis: Unknown Has patient had a PCN reaction that required hospitalization: Unknown Has patient had a PCN reaction occurring within the last 10 years: Unknown If all of the above answers are "NO", then may proceed with Cephalosporin use.      Medication List    TAKE these medications   acetaminophen 500 MG tablet Commonly known as:  TYLENOL Take 1,000 mg by mouth daily as needed for mild pain.   cephALEXin 500 MG capsule Commonly known as:  KEFLEX Take 1 capsule (500 mg total) by mouth 2 (two) times daily for 10 days. Notes to patient:  Given this morning in hospital   dorzolamide-timolol 22.3-6.8 MG/ML ophthalmic solution Commonly known as:  COSOPT Place 1 drop into the right eye 2 (two) times daily. Notes to patient:  Given this morning in hospital   furosemide 20 MG tablet Commonly known as:  LASIX Take 1 tablet (20 mg total) by mouth 2 (two) times daily. What changed:  when to take this Notes to patient:  Given this morning in the hospital   LUMIGAN 0.01 % Soln Generic drug:  bimatoprost Place 1 drop into both eyes at bedtime.   metroNIDAZOLE 0.75 % cream Commonly known as:  METROCREAM Apply 1 application topically daily. Apply to face   multivitamin with minerals Tabs tablet Take 1 tablet by mouth daily.   omeprazole 20 MG capsule Commonly known as:  PRILOSEC Take 20 mg by mouth daily.   potassium chloride 10 MEQ tablet Commonly known as:  K-DUR,KLOR-CON Take 10 mEq by mouth daily.   PREMARIN vaginal cream Generic drug:  conjugated estrogens Place 1 application vaginally 2 (two) times a week.   tamsulosin 0.4 MG Caps capsule Commonly known as:  FLOMAX Take 1 capsule (0.4 mg total) by mouth daily. Notes to patient:  Given this morning in the hospital          Total Time in preparing paper work, data evaluation and todays exam - 35 minutes  Dustin Flock M.D on 03/04/2018 at 2:59 PM [place]  [phone]

## 2018-03-04 NOTE — Progress Notes (Signed)
Pt discharged home with daughter, VSS, room air, follow up information and prescriptions/education provided.

## 2018-03-05 ENCOUNTER — Ambulatory Visit
Admission: RE | Admit: 2018-03-05 | Discharge: 2018-03-05 | Disposition: A | Discharge status: Home / Self Care | Payer: Medicare Other | Source: Ambulatory Visit | Attending: Internal Medicine | Admitting: Internal Medicine

## 2018-03-05 LAB — URINE CULTURE

## 2018-03-12 ENCOUNTER — Ambulatory Visit: Payer: Medicare Other

## 2018-03-13 ENCOUNTER — Ambulatory Visit: Payer: Medicare Other | Admitting: Family

## 2018-03-15 ENCOUNTER — Ambulatory Visit
Admission: RE | Admit: 2018-03-15 | Discharge: 2018-03-15 | Disposition: A | Discharge status: Home / Self Care | Payer: Medicare Other | Source: Ambulatory Visit | Attending: Urology | Admitting: Urology

## 2018-03-15 DIAGNOSIS — I722 Aneurysm of renal artery: Secondary | ICD-10-CM | POA: Insufficient documentation

## 2018-03-15 DIAGNOSIS — N201 Calculus of ureter: Secondary | ICD-10-CM

## 2018-03-15 DIAGNOSIS — N200 Calculus of kidney: Secondary | ICD-10-CM | POA: Insufficient documentation

## 2018-03-16 ENCOUNTER — Ambulatory Visit (INDEPENDENT_AMBULATORY_CARE_PROVIDER_SITE_OTHER): Payer: Medicare Other | Admitting: Urology

## 2018-03-16 ENCOUNTER — Encounter: Payer: Self-pay | Admitting: Urology

## 2018-03-16 VITALS — BP 128/85 | HR 125 | Ht 61.0 in | Wt 125.8 lb

## 2018-03-16 DIAGNOSIS — R102 Pelvic and perineal pain: Secondary | ICD-10-CM

## 2018-03-16 DIAGNOSIS — N390 Urinary tract infection, site not specified: Secondary | ICD-10-CM | POA: Diagnosis not present

## 2018-03-16 DIAGNOSIS — R339 Retention of urine, unspecified: Secondary | ICD-10-CM | POA: Diagnosis not present

## 2018-03-16 DIAGNOSIS — R3129 Other microscopic hematuria: Secondary | ICD-10-CM | POA: Diagnosis not present

## 2018-03-16 LAB — URINALYSIS, COMPLETE
Bilirubin, UA: NEGATIVE
GLUCOSE, UA: NEGATIVE
KETONES UA: NEGATIVE
NITRITE UA: NEGATIVE
Protein, UA: NEGATIVE
SPEC GRAV UA: 1.015 (ref 1.005–1.030)
UUROB: 0.2 mg/dL (ref 0.2–1.0)
pH, UA: 5 (ref 5.0–7.5)

## 2018-03-16 LAB — MICROSCOPIC EXAMINATION

## 2018-03-17 ENCOUNTER — Encounter: Payer: Self-pay | Admitting: Urology

## 2018-03-17 NOTE — Progress Notes (Signed)
03/16/2018 7:24 PM   Kelly Mcbride [birth year] [id number]  Referring provider: Albina Billet, MD [address]   [place], [place] [address]  Chief Complaint  Patient presents with  . Results    HPI: 84 year old female followed for recurrent stone disease and recurrent UTI.  I saw her in early June 2019 and she was complaining of external vaginal burning and was noted to have vaginal erythema.  She was given a trial of fluconazole without improvement in her burning had persisted.  She had brought by a urine in mid July which showed microscopic hematuria and Kelly Mcbride had recommend scheduling CT urogram and cystoscopy.  She presented to the ED on 02/28/2018 complaining of persistent vaginal burning.  A CT abdomen pelvis with contrast was performed which showed nonobstructing renal calculi and a 3 mm left distal ureteral calculus with minimal hydronephrosis.  She went back to the ED on 8/9 complaining of shortness of breath and was subsequently admitted for fluid overload.  She was discharged on 8/11.  Her daughter had called wanting to know if she had to proceed with the CT urogram since she had a CT in the ED.  Since this did show a ureteral calculus and nephrolithiasis I recommended that we hold off on the CT.  She was scheduled for cystoscopy today however her daughter has requested that this not be performed at this time due to her other medical issues.  She has been placed on Premarin cream by her primary provider for her vaginal burning.  She has had multiple episodes of renal colic and complains of no symptoms similar to previous stone pain.  PMH: Past Medical History:  Diagnosis Date  . 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 Breast cancer ([place]) 01/10/2017  . Cardiomyopathy, secondary ([place]) 06/07/2017  . CHF (congestive heart failure) ([place]) 06/07/2017  . Incomplete emptying of bladder 01/29/2014  . Kidney cyst, acquired  01/10/2017  . Knee pain 04/28/2016  . Low back pain 09/25/2012  . Mixed urge and stress incontinence 09/25/2012  . Nephrolithiasis 01/10/2017    Surgical History: Past Surgical History:  Procedure Laterality Date  . APPENDECTOMY    . BREAST SURGERY    . EXTRACORPOREAL SHOCK WAVE LITHOTRIPSY Right 07/06/2017   Procedure: EXTRACORPOREAL SHOCK WAVE LITHOTRIPSY (ESWL);  Surgeon: Kelly Sons, MD;  Location: [facility] ORS;  Service: Urology;  Laterality: Right;    Home Medications:  Allergies as of 03/16/2018      Reactions   Nitrofurantoin Nausea And Vomiting   Sulfa Antibiotics Hives, Rash   Aspirin Other (See Comments)   Reaction: feels like she is having a heart attack/ because of hiatal hernia   Ciprofloxacin Other (See Comments)   Other reaction(s): Other (See Comments) Per patient she get upset stomach and dizziness.    Ibuprofen Other (See Comments)   Other reaction(s): Other (See Comments) Because of hiatal hernia    Penicillins Other (See Comments)   Has patient had a PCN reaction causing immediate rash, facial/tongue/throat swelling, SOB or lightheadedness with hypotension: Unknown Has patient had a PCN reaction causing severe rash involving mucus membranes or skin necrosis: Unknown Has patient had a PCN reaction that required hospitalization: Unknown Has patient had a PCN reaction occurring within the last 10 years: Unknown If all of the above answers are "NO", then may proceed with Cephalosporin use.      Medication List  Accurate as of 03/16/18 11:59 PM. Always use your most recent med list.          acetaminophen 500 MG tablet Commonly known as:  TYLENOL Take 1,000 mg by mouth daily as needed for mild pain.   dorzolamide-timolol 22.3-6.8 MG/ML ophthalmic solution Commonly known as:  COSOPT Place 1 drop into the right eye 2 (two) times daily.   fluconazole 100 MG tablet Commonly known as:  DIFLUCAN   furosemide 20 MG tablet Commonly known as:  LASIX Take  1 tablet (20 mg total) by mouth 2 (two) times daily.   LUMIGAN 0.01 % Soln Generic drug:  bimatoprost Place 1 drop into both eyes at bedtime.   metroNIDAZOLE 0.75 % cream Commonly known as:  METROCREAM Apply 1 application topically daily. Apply to face   multivitamin with minerals Tabs tablet Take 1 tablet by mouth daily.   omeprazole 20 MG capsule Commonly known as:  PRILOSEC Take 20 mg by mouth daily.   potassium chloride 10 MEQ tablet Commonly known as:  K-DUR,KLOR-CON Take 10 mEq by mouth daily.   PREMARIN vaginal cream Generic drug:  conjugated estrogens Place 1 application vaginally 2 (two) times a week.   tamsulosin 0.4 MG Caps capsule Commonly known as:  FLOMAX Take 1 capsule (0.4 mg total) by mouth daily.       Allergies:  Allergies  Allergen Reactions  . Nitrofurantoin Nausea And Vomiting  . Sulfa Antibiotics Hives and Rash  . Aspirin Other (See Comments)    Reaction: feels like she is having a heart attack/ because of hiatal hernia  . Ciprofloxacin Other (See Comments)    Other reaction(s): Other (See Comments) Per patient she get upset stomach and dizziness.   . Ibuprofen Other (See Comments)    Other reaction(s): Other (See Comments) Because of hiatal hernia   . Penicillins Other (See Comments)    Has patient had a PCN reaction causing immediate rash, facial/tongue/throat swelling, SOB or lightheadedness with hypotension: Unknown Has patient had a PCN reaction causing severe rash involving mucus membranes or skin necrosis: Unknown Has patient had a PCN reaction that required hospitalization: Unknown Has patient had a PCN reaction occurring within the last 10 years: Unknown If all of the above answers are "NO", then may proceed with Cephalosporin use.     Family History: History reviewed. No pertinent family history.  Social History:  reports that she has never smoked. She has never used smokeless tobacco. She reports that she does not drink  alcohol or use drugs.  ROS: UROLOGY Frequent Urination?: No Hard to postpone urination?: No Burning/pain with urination?: No Get up at night to urinate?: No Leakage of urine?: No Urine stream starts and stops?: No Trouble starting stream?: No Do you have to strain to urinate?: No Blood in urine?: No Urinary tract infection?: No Sexually transmitted disease?: No Injury to kidneys or bladder?: No Painful intercourse?: No Weak stream?: No Currently pregnant?: No Vaginal bleeding?: No Last menstrual period?: Postmenopausal  Gastrointestinal Nausea?: No Vomiting?: No Indigestion/heartburn?: No Diarrhea?: No Constipation?: No  Constitutional Fever: No Night sweats?: No Weight loss?: No Fatigue?: No  Skin Skin rash/lesions?: No Itching?: No  Eyes Blurred vision?: No Double vision?: No  Ears/Nose/Throat Sore throat?: No Sinus problems?: No  Hematologic/Lymphatic Swollen glands?: No Easy bruising?: No  Cardiovascular Leg swelling?: No Chest pain?: No  Respiratory Cough?: No Shortness of breath?: No  Endocrine Excessive thirst?: No  Musculoskeletal Back pain?: No Joint pain?: No  Neurological Headaches?: No Dizziness?:  No  Psychologic Depression?: No Anxiety?: No  Physical Exam: BP 128/85 (BP Location: Left Arm, Patient Position: Sitting, Cuff Size: Normal)   Pulse (!) 125   Ht 5\' 1"  (1.549 m)   Wt 125 lb 12.8 oz (57.1 kg)   BMI 23.77 kg/m   Constitutional:  Alert, No acute distress. HEENT: [place] AT, moist mucus membranes.  Trachea midline, no masses. Cardiovascular: No clubbing, cyanosis, or edema. Respiratory: Normal respiratory effort, no increased work of breathing. GI: Abdomen is soft, nontender, nondistended, no abdominal masses GU: No CVA tenderness Lymph: No cervical or inguinal lymphadenopathy. Skin: No rashes, bruises or suspicious lesions. Neurologic: Grossly intact, no focal deficits, moving all 4 extremities. Psychiatric:  Normal mood and affect.  Laboratory Data:  Urinalysis Dipstick: Nitrite negative/2+ blood/2+ leukocytes Microscopy: 11-30 WBC/3-10 RBC   Assessment & Plan:   84 year old female with nephrolithiasis and a minimally obstructing left distal ureteral calculus which is asymptomatic.  She has had prior KUBs which showed several small pelvic calcifications and it will be difficult to ascertain the stone on plain x-ray.  I recommended a follow-up noncontrast CT of the pelvis in approximately 1 month and they were instructed to call earlier should she develop unexplained fever or symptoms of renal colic.  She will continue the estrogen cream for her vaginal symptoms.   Kelly Mcbride, [place] [address], [place], [place] [address] [phone]

## 2018-03-18 LAB — CULTURE, URINE COMPREHENSIVE

## 2018-05-01 ENCOUNTER — Ambulatory Visit: Admission: RE | Admit: 2018-05-01 | Payer: Medicare Other | Source: Ambulatory Visit

## 2018-07-10 ENCOUNTER — Encounter: Payer: Self-pay | Admitting: Emergency Medicine

## 2018-07-10 ENCOUNTER — Inpatient Hospital Stay
Admission: EM | Admit: 2018-07-10 | Discharge: 2018-07-13 | DRG: 391 | Disposition: A | Discharge status: Hospice | Payer: Medicare Other | Attending: Internal Medicine | Admitting: Internal Medicine

## 2018-07-10 ENCOUNTER — Emergency Department: Payer: Medicare Other

## 2018-07-10 ENCOUNTER — Other Ambulatory Visit: Payer: Self-pay

## 2018-07-10 DIAGNOSIS — Z886 Allergy status to analgesic agent status: Secondary | ICD-10-CM

## 2018-07-10 DIAGNOSIS — Z66 Do not resuscitate: Secondary | ICD-10-CM | POA: Diagnosis present

## 2018-07-10 DIAGNOSIS — R0600 Dyspnea, unspecified: Secondary | ICD-10-CM

## 2018-07-10 DIAGNOSIS — K440 Diaphragmatic hernia with obstruction, without gangrene: Secondary | ICD-10-CM | POA: Diagnosis present

## 2018-07-10 DIAGNOSIS — Z882 Allergy status to sulfonamides status: Secondary | ICD-10-CM | POA: Diagnosis not present

## 2018-07-10 DIAGNOSIS — I429 Cardiomyopathy, unspecified: Secondary | ICD-10-CM | POA: Diagnosis present

## 2018-07-10 DIAGNOSIS — H409 Unspecified glaucoma: Secondary | ICD-10-CM | POA: Diagnosis present

## 2018-07-10 DIAGNOSIS — Z515 Encounter for palliative care: Secondary | ICD-10-CM | POA: Diagnosis present

## 2018-07-10 DIAGNOSIS — Z881 Allergy status to other antibiotic agents status: Secondary | ICD-10-CM | POA: Diagnosis not present

## 2018-07-10 DIAGNOSIS — K3184 Gastroparesis: Secondary | ICD-10-CM | POA: Diagnosis present

## 2018-07-10 DIAGNOSIS — K449 Diaphragmatic hernia without obstruction or gangrene: Secondary | ICD-10-CM | POA: Diagnosis not present

## 2018-07-10 DIAGNOSIS — K562 Volvulus: Secondary | ICD-10-CM | POA: Diagnosis present

## 2018-07-10 DIAGNOSIS — Z888 Allergy status to other drugs, medicaments and biological substances status: Secondary | ICD-10-CM | POA: Diagnosis not present

## 2018-07-10 DIAGNOSIS — Z853 Personal history of malignant neoplasm of breast: Secondary | ICD-10-CM | POA: Diagnosis not present

## 2018-07-10 DIAGNOSIS — I509 Heart failure, unspecified: Secondary | ICD-10-CM | POA: Diagnosis present

## 2018-07-10 DIAGNOSIS — Z880 Allergy status to penicillin: Secondary | ICD-10-CM | POA: Diagnosis not present

## 2018-07-10 DIAGNOSIS — F0390 Unspecified dementia without behavioral disturbance: Secondary | ICD-10-CM | POA: Diagnosis present

## 2018-07-10 DIAGNOSIS — K219 Gastro-esophageal reflux disease without esophagitis: Secondary | ICD-10-CM | POA: Diagnosis present

## 2018-07-10 DIAGNOSIS — Z79899 Other long term (current) drug therapy: Secondary | ICD-10-CM

## 2018-07-10 DIAGNOSIS — I722 Aneurysm of renal artery: Secondary | ICD-10-CM | POA: Diagnosis present

## 2018-07-10 DIAGNOSIS — Z7189 Other specified counseling: Secondary | ICD-10-CM | POA: Diagnosis not present

## 2018-07-10 LAB — CBC
HEMATOCRIT: 50.2 % — AB (ref 36.0–46.0)
Hemoglobin: 16.1 g/dL — ABNORMAL HIGH (ref 12.0–15.0)
MCH: 28.9 pg (ref 26.0–34.0)
MCHC: 32.1 g/dL (ref 30.0–36.0)
MCV: 90 fL (ref 80.0–100.0)
NRBC: 0 % (ref 0.0–0.2)
Platelets: 270 10*3/uL (ref 150–400)
RBC: 5.58 MIL/uL — ABNORMAL HIGH (ref 3.87–5.11)
RDW: 13.9 % (ref 11.5–15.5)
WBC: 7.7 10*3/uL (ref 4.0–10.5)

## 2018-07-10 LAB — HEPATIC FUNCTION PANEL
ALT: 17 U/L (ref 0–44)
AST: 25 U/L (ref 15–41)
Albumin: 3.3 g/dL — ABNORMAL LOW (ref 3.5–5.0)
Alkaline Phosphatase: 108 U/L (ref 38–126)
BILIRUBIN INDIRECT: 0.5 mg/dL (ref 0.3–0.9)
Bilirubin, Direct: 0.2 mg/dL (ref 0.0–0.2)
TOTAL PROTEIN: 6.7 g/dL (ref 6.5–8.1)
Total Bilirubin: 0.7 mg/dL (ref 0.3–1.2)

## 2018-07-10 LAB — BASIC METABOLIC PANEL
Anion gap: 6 (ref 5–15)
BUN: 22 mg/dL (ref 8–23)
CHLORIDE: 105 mmol/L (ref 98–111)
CO2: 27 mmol/L (ref 22–32)
CREATININE: 1.34 mg/dL — AB (ref 0.44–1.00)
Calcium: 10.2 mg/dL (ref 8.9–10.3)
GFR calc non Af Amer: 34 mL/min — ABNORMAL LOW (ref >60)
GFR, EST AFRICAN AMERICAN: 39 mL/min — AB (ref >60)
Glucose, Bld: 161 mg/dL — ABNORMAL HIGH (ref 70–99)
Potassium: 4.8 mmol/L (ref 3.5–5.1)
SODIUM: 138 mmol/L (ref 135–145)

## 2018-07-10 LAB — URINALYSIS, ROUTINE W REFLEX MICROSCOPIC
BILIRUBIN URINE: NEGATIVE
Glucose, UA: NEGATIVE mg/dL
Ketones, ur: NEGATIVE mg/dL
NITRITE: NEGATIVE
PH: 7 (ref 5.0–8.0)
Protein, ur: 30 mg/dL — AB
SPECIFIC GRAVITY, URINE: 1.009 (ref 1.005–1.030)
WBC, UA: >50 WBC/hpf — ABNORMAL HIGH (ref 0–5)

## 2018-07-10 LAB — BRAIN NATRIURETIC PEPTIDE: B Natriuretic Peptide: 84 pg/mL (ref 0.0–100.0)

## 2018-07-10 LAB — LIPASE, BLOOD: LIPASE: 55 U/L — AB (ref 11–51)

## 2018-07-10 LAB — TROPONIN I

## 2018-07-10 MED ORDER — ONDANSETRON HCL 4 MG PO TABS: 4.0000 mg | ORAL_TABLET | Freq: Four times a day (QID) | ORAL | Status: DC | PRN

## 2018-07-10 MED ORDER — ACETAMINOPHEN 650 MG RE SUPP: 650.0000 mg | Freq: Four times a day (QID) | RECTAL | Status: DC | PRN

## 2018-07-10 MED ORDER — ACETAMINOPHEN 325 MG PO TABS: 650.0000 mg | ORAL_TABLET | Freq: Four times a day (QID) | ORAL | Status: DC | PRN

## 2018-07-10 MED ORDER — SODIUM CHLORIDE 0.9 % IV SOLN
INTRAVENOUS | Status: DC
  Administered 2018-07-10: 23:00:00 via INTRAVENOUS

## 2018-07-10 MED ORDER — LATANOPROST 0.005 % OP SOLN
1.0000 [drp] | Freq: Every day | OPHTHALMIC | Status: DC
  Administered 2018-07-11 – 2018-07-12 (×2): 1 [drp] via OPHTHALMIC
  Filled 2018-07-10: qty 2.5

## 2018-07-10 MED ORDER — ONDANSETRON HCL 4 MG/2ML IJ SOLN: 4.0000 mg | Freq: Four times a day (QID) | INTRAMUSCULAR | Status: DC | PRN

## 2018-07-10 MED ORDER — ENOXAPARIN SODIUM 30 MG/0.3ML ~~LOC~~ SOLN
30.0000 mg | SUBCUTANEOUS | Status: DC
  Administered 2018-07-11 – 2018-07-12 (×2): 30 mg via SUBCUTANEOUS
  Filled 2018-07-10 (×2): qty 0.3

## 2018-07-10 NOTE — Plan of Care (Signed)
Dr. Kerman Passey (ED) contacted me regarding Kelly Mcbride and asked me to review her CT scan.  She has a very large hiatal hernia with her entire stomach within the thoracic cavity. It is distended and the radiologist commented that there was organoaxial volvulus, without signs of tissue compromise. A prior scan from August of this year showed not only stomach but also loops of small bowel within the chest. Normally, volvulus is an indication for emergent operative intervention, however this would be a massive operation in a frail 84 year old woman and one that might best be performed at a tertiary referral center.  The patient and her daughter decline any operative intervention, regardless of the potential for life-ending complications.  I suggested contacting GI to see if they had any non-surgical options (unlikely); a NGT may help decompress the stomach and alleviate her discomfort.  Due to family refusal of surgery, will not pursue formal consult. Fredirick Maudlin

## 2018-07-10 NOTE — ED Notes (Signed)
Joelene Millin RN, aware of bed assigned

## 2018-07-10 NOTE — Progress Notes (Signed)
Advanced care plan.  Purpose of the Encounter: CODE STATUS  Parties in Attendance: Patient and family  Patient's Decision Capacity: Has dementia  Subjective/Patient's story: Was referred from cardiology office for difficulty breathing and condition   Objective/Medical story Patient has large hiatal hernia encroaching into the thoracic cavity Needs surgery evaluation NG tube with suction   Goals of care determination:  Advance care directives goals of care and treatment plan discussed Patients family do not want any CPR, intubation and ventilator if the need arises   CODE STATUS: DNR   Time spent discussing advanced care planning: 16 minutes

## 2018-07-10 NOTE — ED Notes (Signed)
NG tube placed.

## 2018-07-10 NOTE — Progress Notes (Signed)
Anticoagulation monitoring(Lovenox):  84 yo female ordered Lovenox 40 mg Q24h  Filed Weights   07/10/18 1726  Weight: 124 lb (56.2 kg)   BMI    Lab Results  Component Value Date   CREATININE 1.34 (H) 07/10/2018   CREATININE 1.26 (H) 03/03/2018   CREATININE 1.16 (H) 03/02/2018   Estimated Creatinine Clearance: 21.7 mL/min (A) (by C-G formula based on SCr of 1.34 mg/dL (H)). Hemoglobin & Hematocrit     Component Value Date/Time   HGB 16.1 (H) 07/10/2018 1729   HGB 15.3 09/18/2012 2114   HCT 50.2 (H) 07/10/2018 1729   HCT 45.8 09/18/2012 2114     Per Protocol for Patient with estCrcl < 30 ml/min and BMI < 40, will transition to Lovenox 30 mg Q24h.

## 2018-07-10 NOTE — ED Triage Notes (Signed)
Pt family reports that the pt has not been feeling well and has been SOB having epigastric pain and nauseated for the several days. Family reports that she thought that she may have a cold at the beginning of the month got better for a bit but has now gotten worse. She went to Dr. Marianna Payment office and he sent her here to be evaluated.

## 2018-07-10 NOTE — H&P (Signed)
[facility] NAME: Kelly Mcbride    MR#:  [id number]  DATE OF BIRTH:  1922/12/28  DATE OF ADMISSION:  07/10/2018  PRIMARY CARE PHYSICIAN: Albina Billet, MD   REQUESTING/REFERRING PHYSICIAN:   CHIEF COMPLAINT:   Chief Complaint  Patient presents with  . Shortness of Breath  . Chest Pain  . Nausea    HISTORY OF PRESENT ILLNESS: Kelly Mcbride  is a 84 y.o. female with a known history of renal artery aneurysm, cardiomyopathy, chronic congestive heart failure, hiatal hernia, nephrolithiasis was referred from cardiology office for cough and congestion.  Patient had some difficulty breathing for the last couple of days.  She is increased cough with sputum production and abdominal discomfort.  Patient states she has been bloated.  Patient was worked up in the emergency room with CT chest and abdomen which revealed a large hiatal hernia.  Case was discussed with surgical attending by ER physician.  NG tube with suction was plan for conservative management.  Patient's family does not want any aggressive measures such as surgery for her hiatal hernia.  BNP is normal.  Patient has poor oral intake.  Dependent on activities of daily living.  She is DNR by CODE STATUS.  PAST MEDICAL HISTORY:   Past Medical History:  Diagnosis Date  . Aneurysm of renal artery ([place]) 01/10/2017  . 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 Breast cancer ([place]) 01/10/2017  . Cardiomyopathy, secondary ([place]) 06/07/2017  . CHF (congestive heart failure) ([place]) 06/07/2017  . Incomplete emptying of bladder 01/29/2014  . Kidney cyst, acquired 01/10/2017  . Knee pain 04/28/2016  . Low back pain 09/25/2012  . Mixed urge and stress incontinence 09/25/2012  . Nephrolithiasis 01/10/2017    PAST SURGICAL HISTORY:  Past Surgical History:  Procedure Laterality Date  . APPENDECTOMY    . BREAST SURGERY    . EXTRACORPOREAL SHOCK WAVE LITHOTRIPSY Right 07/06/2017   Procedure:  EXTRACORPOREAL SHOCK WAVE LITHOTRIPSY (ESWL);  Surgeon: Abbie Sons, MD;  Location: [facility] ORS;  Service: Urology;  Laterality: Right;    SOCIAL HISTORY:  Social History   Tobacco Use  . Smoking status: Never Smoker  . Smokeless tobacco: Never Used  Substance Use Topics  . Alcohol use: No    FAMILY HISTORY: History reviewed. No pertinent family history.  DRUG ALLERGIES:  Allergies  Allergen Reactions  . Nitrofurantoin Nausea And Vomiting  . Sulfa Antibiotics Hives and Rash  . Aspirin Other (See Comments)    Reaction: feels like she is having a heart attack/ because of hiatal hernia  . Ciprofloxacin Other (See Comments)    Other reaction(s): Other (See Comments) Per patient she get upset stomach and dizziness.   . Ibuprofen Other (See Comments)    Other reaction(s): Other (See Comments) Because of hiatal hernia   . Penicillins Other (See Comments)    Has patient had a PCN reaction causing immediate rash, facial/tongue/throat swelling, SOB or lightheadedness with hypotension: Unknown Has patient had a PCN reaction causing severe rash involving mucus membranes or skin necrosis: Unknown Has patient had a PCN reaction that required hospitalization: Unknown Has patient had a PCN reaction occurring within the last 10 years: Unknown If all of the above answers are "NO", then may proceed with Cephalosporin use.     REVIEW OF SYSTEMS:  Could not be obtained as patient has dementia MEDICATIONS AT HOME:  Prior to Admission medications   Medication Sig Start Date End  Date Taking? Authorizing Provider  acetaminophen (TYLENOL) 500 MG tablet Take 1,000 mg by mouth daily as needed for mild pain.    Yes [provider]  bimatoprost (LUMIGAN) 0.01 % SOLN Place 1 drop into both eyes at bedtime.    Yes [provider]  dorzolamide-timolol (COSOPT) 22.3-6.8 MG/ML ophthalmic solution Place 1 drop into the right eye 2 (two) times daily.  06/07/17  Yes [provider]   metroNIDAZOLE (METROCREAM) 0.75 % cream Apply 1 application topically daily. Apply to face 01/15/18  Yes [provider]  Multiple Vitamin (MULTIVITAMIN WITH MINERALS) TABS tablet Take 1 tablet by mouth daily. 11/09/17  Yes Wieting, Richard, MD  omeprazole (PRILOSEC) 20 MG capsule Take 20 mg by mouth daily.  11/01/13  Yes [provider]  potassium chloride (K-DUR,KLOR-CON) 10 MEQ tablet Take 10 mEq by mouth daily. 02/13/18  Yes [provider]  PREMARIN vaginal cream Place 1 application vaginally 2 (two) times a week.  02/21/18  Yes [provider]  fluconazole (DIFLUCAN) 100 MG tablet  03/13/18   [provider]  furosemide (LASIX) 20 MG tablet Take 1 tablet (20 mg total) by mouth 2 (two) times daily. Patient not taking: Reported on 07/10/2018 03/04/18   Dustin Flock, MD  tamsulosin (FLOMAX) 0.4 MG CAPS capsule Take 1 capsule (0.4 mg total) by mouth daily. Patient not taking: Reported on 07/10/2018 02/28/18   Orbie Pyo, MD      PHYSICAL EXAMINATION:   VITAL SIGNS: Blood pressure (!) 145/91, pulse (!) 111, temperature 97.9 F (36.6 C), temperature source Oral, resp. rate (!) 34, height 5\' 3"  (1.6 m), weight 56.2 kg, SpO2 96 %.  GENERAL:  84 y.o.-year-old patient lying in the bed  EYES: Pupils equal, round, reactive to light and accommodation. No scleral icterus. Extraocular muscles intact.  HEENT: Head atraumatic, normocephalic. Oropharynx and nasopharynx clear.  NECK:  Supple, no jugular venous distention. No thyroid enlargement, no tenderness.  LUNGS: Normal breath sounds bilaterally, no wheezing, rales,rhonchi or crepitation. No use of accessory muscles of respiration.  CARDIOVASCULAR: S1, S2 tachycardia noted. No murmurs, rubs, or gallops.  ABDOMEN: Soft, nontender, nondistended. Bowel sounds present. No organomegaly or mass.  EXTREMITIES: No pedal edema, cyanosis, or clubbing.  NEUROLOGIC: Demented PSYCHIATRIC: The patient is  alert and oriented x none.  SKIN: No obvious rash, lesion, or ulcer.   LABORATORY PANEL:   CBC Recent Labs  Lab 07/10/18 1729  WBC 7.7  HGB 16.1*  HCT 50.2*  PLT 270  MCV 90.0  MCH 28.9  MCHC 32.1  RDW 13.9   ------------------------------------------------------------------------------------------------------------------  Chemistries  Recent Labs  Lab 07/10/18 1729  NA 138  K 4.8  CL 105  CO2 27  GLUCOSE 161*  BUN 22  CREATININE 1.34*  CALCIUM 10.2  AST 25  ALT 17  ALKPHOS 108  BILITOT 0.7   ------------------------------------------------------------------------------------------------------------------ estimated creatinine clearance is 21.7 mL/min (A) (by C-G formula based on SCr of 1.34 mg/dL (H)). ------------------------------------------------------------------------------------------------------------------ No results for input(s): TSH, T4TOTAL, T3FREE, THYROIDAB in the last 72 hours.  Invalid input(s): FREET3   Coagulation profile No results for input(s): INR, PROTIME in the last 168 hours. ------------------------------------------------------------------------------------------------------------------- No results for input(s): DDIMER in the last 72 hours. -------------------------------------------------------------------------------------------------------------------  Cardiac Enzymes Recent Labs  Lab 07/10/18 1729  TROPONINI <0.03   ------------------------------------------------------------------------------------------------------------------ Invalid input(s): POCBNP  ---------------------------------------------------------------------------------------------------------------  Urinalysis    Component Value Date/Time   COLORURINE YELLOW (A) 07/10/2018 1937   APPEARANCEUR CLEAR (A) 07/10/2018 1937   APPEARANCEUR Cloudy (  A) 03/16/2018 1605   LABSPEC 1.009 07/10/2018 1937   PHURINE 7.0 07/10/2018 1937   GLUCOSEU NEGATIVE 07/10/2018  1937   HGBUR MODERATE (A) 07/10/2018 1937   BILIRUBINUR NEGATIVE 07/10/2018 1937   BILIRUBINUR Negative 03/16/2018 [place] 07/10/2018 1937   PROTEINUR 30 (A) 07/10/2018 1937   NITRITE NEGATIVE 07/10/2018 1937   LEUKOCYTESUR LARGE (A) 07/10/2018 1937   LEUKOCYTESUR 2+ (A) 03/16/2018 1605     RADIOLOGY: Ct Chest Wo Contrast  Result Date: 07/10/2018 CLINICAL DATA:  Shortness of breath, epigastric abdominal pain and nausea for several days. EXAM: CT CHEST WITHOUT CONTRAST TECHNIQUE: Multidetector CT imaging of the chest was performed following the standard protocol without IV contrast. COMPARISON:  Abdominal CT scan 02/28/2018 FINDINGS: Cardiovascular: The heart is normal in size. No pericardial effusion. There is tortuosity, ectasia and elongation of the thoracic aorta but no focal aneurysm. Scattered calcifications. Mediastinum/Nodes: No mediastinal or hilar mass or lymphadenopathy. Patient has the extremely large hiatal hernia. The entire stomach is up in the chest along with the transverse colon. Organo-axial volvulus of the stomach which is moderately distended. I do not seen E obvious pneumatosis to suggest ischemia. The second portion the duodenum is slightly narrowed is a comes through the hiatal hernia but no do not see an obvious obstruction. Colon is not obstructed. I do not see any abnormal fat to suggest incarceration. Lungs/Pleura: There are small bilateral pleural effusions noted. No infiltrates or mass. Upper Abdomen: Right adrenal gland adenoma is noted. 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 Right renal calculi are noted. Left-sided parapelvic cysts and calculi. Musculoskeletal: No acute bony findings. Advanced degenerative changes involving the thoracic spine and osteoporosis. IMPRESSION: 1. Large hiatal hernia with the entire stomach up in the chest and organo-axial volvulus. Moderate distention of the stomach is noted and there could be a partial gastric  outlet obstruction but I do not see any pneumatosis or significant gastric wall thickening to suggest an ischemic process. Gastroparesis is also possible. 2. Small bilateral pleural effusions and overlying atelectasis but no definite infiltrates or edema. 3. 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 Aortic Atherosclerosis (ICD10-I70.0). Electronically Signed   By: Marijo Sanes M.D.   On: 07/10/2018 19:34    EKG: Orders placed or performed during the hospital encounter of 07/10/18  . ED EKG within 10 minutes  . ED EKG within 10 minutes  . EKG 12-Lead  . EKG 12-Lead    IMPRESSION AND PLAN:  84 year old elderly female patient with history of congestive heart failure, cardiomyopathy, renal artery aneurysm, breast cancer, nephrolithiasis was referred from cardiology office for shortness of breath and congestion  -Dyspnea secondary to large hiatal hernia with entire stomach up into the chest and organoaxial volvulus Discussed with patient's family in detail They do not want any surgery Conservative measures NG tube with suction Case was discussed by ER physician with surgical attending. The surgery for massive hiatal hernia is a big operation and patient overall performance status poor.  Family do not want any aggressive measures as a surgery.  Just conservative management  -Gastroparesis versus gastric outlet obstruction Keep patient n.p.o. for now Gentle IV fluids Antiemetics intravenously  -Renal artery aneurysm Supportive care  -Cardiomyopathy Medical management  -Prognosis poor  -High risk of morbidity and mortality  All the records are reviewed and case discussed with ED provider. Management plans discussed with the patient, family and they are in agreement.  CODE STATUS:DNR Code Status History  Date Active Date Inactive Code Status Order ID Comments User Context   03/03/2018 0147 03/04/2018 1717 DNR [id number]  Lance Coon, MD Inpatient   11/08/2017 1016 11/09/2017  [place] DNR [id number]  Loletha Grayer, MD Inpatient   11/07/2017 2231 11/08/2017 1016 Full Code [id number]  Amelia Jo, MD ED    Questions for Most Recent Historical Code Status (Order [id number])    Question Answer Comment   In the event of cardiac or respiratory ARREST Do not call a "code blue"    In the event of cardiac or respiratory ARREST Do not perform Intubation, CPR, defibrillation or ACLS    In the event of cardiac or respiratory ARREST Use medication by any route, position, wound care, and other measures to relive pain and suffering. May use oxygen, suction and manual treatment of airway obstruction as needed for comfort.    Comments nurse may pronounce        TOTAL TIME TAKING CARE OF THIS PATIENT: 52 minutes.    Saundra Shelling M.D on 07/10/2018 at 8:38 PM  Between 7am to 6pm - Pager - [phone]  After 6pm go to [email or web address] - password [email or web address] [facility]  [facility]  Office  [phone]  CC: Primary care physician; Albina Billet, MD

## 2018-07-10 NOTE — ED Provider Notes (Signed)
[facility] Emergency Department Provider Note  Time seen: 7:10 PM  I have reviewed the triage vital signs and the nursing notes.   HISTORY  Chief Complaint Shortness of Breath; Chest Pain; and Nausea    HPI Kelly Mcbride is a 84 y.o. female with a past medical history of CHF, cardiomyopathy, presents to the emergency department from her cardiologist office for cough, congestion and shortness of breath.  According to the daughter for the past 3 to 4 days patient has been coughing with occasional sputum production has been more short of breath and experiencing mild abdominal/epigastric discomfort.  Patient was seen by cardiology today Dr. Etta Quill office and was ultimately sent here for evaluation and admission per daughter given the patient's ongoing shortness of breath at home, and concern for pneumonia versus CHF on chest x-ray performed in the office.  Here the patient is awake alert, she is in no distress denies any complaints at this time.   Past Medical History:  Diagnosis Date  . Aneurysm of renal artery ([place]) 01/10/2017  . 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 Breast cancer ([place]) 01/10/2017  . Cardiomyopathy, secondary ([place]) 06/07/2017  . CHF (congestive heart failure) ([place]) 06/07/2017  . Incomplete emptying of bladder 01/29/2014  . Kidney cyst, acquired 01/10/2017  . Knee pain 04/28/2016  . Low back pain 09/25/2012  . Mixed urge and stress incontinence 09/25/2012  . Nephrolithiasis 01/10/2017    Patient Active Problem List   Diagnosis Date Noted  . GERD (gastroesophageal reflux disease) 03/02/2018  . HLD (hyperlipidemia) 03/02/2018  . Malnutrition of moderate degree 11/08/2017  . Cardiomyopathy, secondary ([place]) 06/07/2017  . Acute on chronic diastolic CHF (congestive heart failure) ([place]) 06/07/2017  . Calculus of ureter 06/07/2017  . Nephrolithiasis 01/10/2017  . Kidney cyst, acquired 01/10/2017  . Aneurysm of renal artery ([place]) 01/10/2017  .  Breast cancer ([place]) 01/10/2017  . History of appendectomy 01/10/2017  . Knee pain 04/28/2016  . Incomplete emptying of bladder 01/29/2014  . 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 Low back pain 09/25/2012  . Mixed urge and stress incontinence 09/25/2012    Past Surgical History:  Procedure Laterality Date  . APPENDECTOMY    . BREAST SURGERY    . EXTRACORPOREAL SHOCK WAVE LITHOTRIPSY Right 07/06/2017   Procedure: EXTRACORPOREAL SHOCK WAVE LITHOTRIPSY (ESWL);  Surgeon: Abbie Sons, MD;  Location: [facility] ORS;  Service: Urology;  Laterality: Right;    Prior to Admission medications   Medication Sig Start Date End Date Taking? Authorizing Provider  acetaminophen (TYLENOL) 500 MG tablet Take 1,000 mg by mouth daily as needed for mild pain.    Yes [provider]  bimatoprost (LUMIGAN) 0.01 % SOLN Place 1 drop into both eyes at bedtime.    Yes [provider]  dorzolamide-timolol (COSOPT) 22.3-6.8 MG/ML ophthalmic solution Place 1 drop into the right eye 2 (two) times daily.  06/07/17  Yes [provider]  metroNIDAZOLE (METROCREAM) 0.75 % cream Apply 1 application topically daily. Apply to face 01/15/18  Yes [provider]  Multiple Vitamin (MULTIVITAMIN WITH MINERALS) TABS tablet Take 1 tablet by mouth daily. 11/09/17  Yes Wieting, Richard, MD  omeprazole (PRILOSEC) 20 MG capsule Take 20 mg by mouth daily.  11/01/13  Yes [provider]  potassium chloride (K-DUR,KLOR-CON) 10 MEQ tablet Take 10 mEq by mouth daily. 02/13/18  Yes [provider]  PREMARIN vaginal cream Place 1 application vaginally 2 (two) times a week.  02/21/18  Yes [provider]  fluconazole (DIFLUCAN) 100 MG tablet  03/13/18   [provider]  furosemide (LASIX) 20 MG tablet Take 1 tablet (20 mg total) by mouth 2 (two) times daily. Patient not taking: Reported on 07/10/2018 03/04/18   Dustin Flock, MD  tamsulosin (FLOMAX) 0.4 MG CAPS capsule  Take 1 capsule (0.4 mg total) by mouth daily. Patient not taking: Reported on 07/10/2018 02/28/18   Orbie Pyo, MD    Allergies  Allergen Reactions  . Nitrofurantoin Nausea And Vomiting  . Sulfa Antibiotics Hives and Rash  . Aspirin Other (See Comments)    Reaction: feels like she is having a heart attack/ because of hiatal hernia  . Ciprofloxacin Other (See Comments)    Other reaction(s): Other (See Comments) Per patient she get upset stomach and dizziness.   . Ibuprofen Other (See Comments)    Other reaction(s): Other (See Comments) Because of hiatal hernia   . Penicillins Other (See Comments)    Has patient had a PCN reaction causing immediate rash, facial/tongue/throat swelling, SOB or lightheadedness with hypotension: Unknown Has patient had a PCN reaction causing severe rash involving mucus membranes or skin necrosis: Unknown Has patient had a PCN reaction that required hospitalization: Unknown Has patient had a PCN reaction occurring within the last 10 years: Unknown If all of the above answers are "NO", then may proceed with Cephalosporin use.     History reviewed. No pertinent family history.  Social History Social History   Tobacco Use  . Smoking status: Never Smoker  . Smokeless tobacco: Never Used  Substance Use Topics  . Alcohol use: No  . Drug use: No    Review of Systems Constitutional: Negative for fever. Cardiovascular: Negative for chest pain. Respiratory: Positive for shortness of breath.  Positive for cough. Gastrointestinal: Mild epigastric discomfort.  Negative for vomiting Genitourinary: Negative for urinary compaints Musculoskeletal: Slightly increased lower extremity edema per daughter Skin: Negative for skin complaints  Neurological: Negative for headache All other ROS negative  ____________________________________________   PHYSICAL EXAM:  VITAL SIGNS: ED Triage Vitals  Enc Vitals Group     BP 07/10/18 1725 113/76      Pulse Rate 07/10/18 1725 (!) 113     Resp 07/10/18 1725 16     Temp 07/10/18 1725 97.9 F (36.6 C)     Temp Source 07/10/18 1725 Oral     SpO2 07/10/18 1725 94 %     Weight 07/10/18 1726 124 lb (56.2 kg)     Height 07/10/18 1726 5\' 3"  (1.6 m)     Head Circumference --      Peak Flow --      Pain Score 07/10/18 1725 5     Pain Loc --      Pain Edu? --      Excl. in [place]? --    Constitutional: Alert and oriented. Well appearing and in no distress. Eyes: Normal exam ENT   Head: Normocephalic and atraumatic   Mouth/Throat: Mucous membranes are moist. Cardiovascular: Regular rhythm rate around 110 bpm. Respiratory: Normal respiratory effort without tachypnea nor retractions. Breath sounds are clear, no obvious wheeze rales or rhonchi. Gastrointestinal: Very slight epigastric tenderness.  No rebound guarding or distention. Musculoskeletal: Nontender with normal range of motion in all extremities.  Mild lower extremity edema bilaterally Neurologic:  Normal speech and language. No gross focal neurologic deficits  Skin:  Skin is warm, dry and intact.  Psychiatric: Mood and affect are normal.   ____________________________________________  EKG  EKG viewed and interpreted by myself shows a sinus tachycardia 114 bpm with a narrow QRS, normal axis, normal intervals, nonspecific ST changes.  No ST elevation.  ____________________________________________    RADIOLOGY  IMPRESSION: 1. Large hiatal hernia with the entire stomach up in the chest and organo-axial volvulus. Moderate distention of the stomach is noted and there could be a partial gastric outlet obstruction but I do not see any pneumatosis or significant gastric wall thickening to suggest an ischemic process. Gastroparesis is also possible. 2. Small bilateral pleural effusions and overlying atelectasis but no definite infiltrates or edema. 3. 16 mm rim calcified right renal artery  aneurysm.  ____________________________________________   INITIAL IMPRESSION / ASSESSMENT AND PLAN / ED COURSE  Pertinent labs & imaging results that were available during my care of the patient were reviewed by me and considered in my medical decision making (see chart for details).  Patient presents to the emergency department for shortness of breath cough lower extremity edema ongoing for 3 to 4 days.  Patient was seen at cardiology today and sent to the ED for further work-up.  Currently the patient appears well does have an occasional cough, has mild lower extremity edema she is somewhat tachycardic currently around 110 bpm.  Oxygen level around 96% on room air.  Will obtain a CT scan of the chest to further evaluate and help to decipher between CHF, pneumonia, other pathology.  We will check labs including cardiac enzymes.  Given the patient's complaint of intermittent abdominal discomfort we will also add on LFTs, lipase and perform a urinalysis.  Patient daughter agreeable to plan of care.  CT scans resulted showing extremely large hiatal hernia with the entire stomach and part of the transverse colon within the chest cavity.  Possible volvulus possible partial gastric outlet obstruction.  I discussed the patient with general surgery they recommend placing an NG tube for intermittent suction, they also recommend speaking with GI and if they recommend surgery transfer to a tertiary care center.  I discussed patient with GI they do not believe that this could be treated endoscopically and would require surgery.  I discussed this with the patient daughter.  Daughter states under no circumstance that they want surgery.  Patient is 84 years old with dementia and they are not interested in surgery.  We will place an NG tube placed on low intermittent suction admit to the hospitalist service for continued treatment and monitoring.  ____________________________________________   FINAL CLINICAL  IMPRESSION(S) / ED DIAGNOSES  Dyspnea Hiatal hernia   Harvest Dark, MD 07/10/18 2008

## 2018-07-11 DIAGNOSIS — K449 Diaphragmatic hernia without obstruction or gangrene: Secondary | ICD-10-CM

## 2018-07-11 DIAGNOSIS — Z7189 Other specified counseling: Secondary | ICD-10-CM

## 2018-07-11 DIAGNOSIS — R0600 Dyspnea, unspecified: Secondary | ICD-10-CM

## 2018-07-11 DIAGNOSIS — Z66 Do not resuscitate: Secondary | ICD-10-CM

## 2018-07-11 DIAGNOSIS — Z515 Encounter for palliative care: Secondary | ICD-10-CM

## 2018-07-11 LAB — BASIC METABOLIC PANEL
Anion gap: 6 (ref 5–15)
BUN: 21 mg/dL (ref 8–23)
CO2: 25 mmol/L (ref 22–32)
Calcium: 10.1 mg/dL (ref 8.9–10.3)
Chloride: 109 mmol/L (ref 98–111)
Creatinine, Ser: 1.11 mg/dL — ABNORMAL HIGH (ref 0.44–1.00)
GFR calc Af Amer: 50 mL/min — ABNORMAL LOW (ref >60)
GFR, EST NON AFRICAN AMERICAN: 43 mL/min — AB (ref >60)
GLUCOSE: 114 mg/dL — AB (ref 70–99)
Potassium: 4.2 mmol/L (ref 3.5–5.1)
Sodium: 140 mmol/L (ref 135–145)

## 2018-07-11 LAB — CBC
HCT: 47.9 % — ABNORMAL HIGH (ref 36.0–46.0)
Hemoglobin: 15.6 g/dL — ABNORMAL HIGH (ref 12.0–15.0)
MCH: 28.9 pg (ref 26.0–34.0)
MCHC: 32.6 g/dL (ref 30.0–36.0)
MCV: 88.9 fL (ref 80.0–100.0)
Platelets: 259 10*3/uL (ref 150–400)
RBC: 5.39 MIL/uL — ABNORMAL HIGH (ref 3.87–5.11)
RDW: 13.7 % (ref 11.5–15.5)
WBC: 7.8 10*3/uL (ref 4.0–10.5)
nRBC: 0 % (ref 0.0–0.2)

## 2018-07-11 MED ORDER — PANTOPRAZOLE SODIUM 40 MG IV SOLR
40.0000 mg | INTRAVENOUS | Status: DC
  Administered 2018-07-11: 40 mg via INTRAVENOUS
  Filled 2018-07-11: qty 40

## 2018-07-11 MED ORDER — MORPHINE SULFATE (PF) 2 MG/ML IV SOLN
1.0000 mg | Freq: Once | INTRAVENOUS | Status: AC
  Administered 2018-07-11: 1 mg via INTRAVENOUS
  Filled 2018-07-11: qty 1

## 2018-07-11 MED ORDER — DORZOLAMIDE HCL-TIMOLOL MAL 2-0.5 % OP SOLN
1.0000 [drp] | Freq: Two times a day (BID) | OPHTHALMIC | Status: DC
  Administered 2018-07-11 – 2018-07-13 (×4): 1 [drp] via OPHTHALMIC
  Filled 2018-07-11: qty 10

## 2018-07-11 NOTE — Consult Note (Signed)
GI Inpatient Consult Note  Reason for Consult: Hiatal hernia    Attending Requesting Consult: Dr. Tressia Miners, MD  History of Present Illness: Kelly Mcbride is a 84 y.o. female seen for evaluation of large hiatal hernia at the request of Dr. Royetta Car. Pt has PMH of renal artery aneurysm, cardiomyopathy, chronic congestive heart failure, nephrolithiasis who was admitted yesterday for cough, congestion, and epigastric abdominal discomfort. She had CT chest w/o contrast which showed large hiatal hernia with her entire stomach within the thoracic cavity and organo-axial volvulus, concern for partial gastric outlet obstruction. Pt and her family have refused any surgical intervention and GI was consulted for any other non-surgical suggestions. She has been on NG tube with suction with no output.  Pt seen and examined resting comfortably in hospital bed. Her daughter is present in the room and helps with history. No acute events overnight. She endorses some discomfort because of the NG tube. Pt endorses some mild nausea but no episodes of vomiting. She reports her upper abdomen is tender to the touch. No bowel movements since admission. She does endorse flatulence. Pt lives at home with daughter and has caretaker when her daughter is at work. She likes to eat the same thing daily - usually some apple butter toast with banana for breakfast, with snacks of [employer], blueberries, or Little Debbie cakes. She likes to snack on chocolate and peppermint. She is compliant with omeprazole 20 mg daily at home to help manage GERD exacerbations. She endorses some abdominal bloating and sensations of early satiety worsening over the past few days. Seems to be better than yesterday.    Past Medical History:  Past Medical History:  Diagnosis Date  . Aneurysm of renal artery ([place]) 01/10/2017  . 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 Breast cancer ([place]) 01/10/2017  . Cardiomyopathy, secondary ([place]) 06/07/2017  . CHF  (congestive heart failure) ([place]) 06/07/2017  . Incomplete emptying of bladder 01/29/2014  . Kidney cyst, acquired 01/10/2017  . Knee pain 04/28/2016  . Low back pain 09/25/2012  . Mixed urge and stress incontinence 09/25/2012  . Nephrolithiasis 01/10/2017    Problem List: Patient Active Problem List   Diagnosis Date Noted  . Hiatal hernia 07/10/2018  . GERD (gastroesophageal reflux disease) 03/02/2018  . HLD (hyperlipidemia) 03/02/2018  . Malnutrition of moderate degree 11/08/2017  . Cardiomyopathy, secondary ([place]) 06/07/2017  . Acute on chronic diastolic CHF (congestive heart failure) ([place]) 06/07/2017  . Calculus of ureter 06/07/2017  . Nephrolithiasis 01/10/2017  . Kidney cyst, acquired 01/10/2017  . Aneurysm of renal artery ([place]) 01/10/2017  . Breast cancer ([place]) 01/10/2017  . History of appendectomy 01/10/2017  . Knee pain 04/28/2016  . Incomplete emptying of bladder 01/29/2014  . 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 Low back pain 09/25/2012  . Mixed urge and stress incontinence 09/25/2012    Past Surgical History: Past Surgical History:  Procedure Laterality Date  . APPENDECTOMY    . BREAST SURGERY    . EXTRACORPOREAL SHOCK WAVE LITHOTRIPSY Right 07/06/2017   Procedure: EXTRACORPOREAL SHOCK WAVE LITHOTRIPSY (ESWL);  Surgeon: Abbie Sons, MD;  Location: [facility] ORS;  Service: Urology;  Laterality: Right;    Allergies: Allergies  Allergen Reactions  . Nitrofurantoin Nausea And Vomiting  . Sulfa Antibiotics Hives and Rash  . Aspirin Other (See Comments)    Reaction: feels like she is having a heart attack/ because of hiatal hernia  . Ciprofloxacin Other (See Comments)    Other reaction(s): Other (See  Comments) Per patient she get upset stomach and dizziness.   . Ibuprofen Other (See Comments)    Other reaction(s): Other (See Comments) Because of hiatal hernia   . Penicillins Other (See Comments)    Has patient had a PCN reaction causing immediate rash,  facial/tongue/throat swelling, SOB or lightheadedness with hypotension: Unknown Has patient had a PCN reaction causing severe rash involving mucus membranes or skin necrosis: Unknown Has patient had a PCN reaction that required hospitalization: Unknown Has patient had a PCN reaction occurring within the last 10 years: Unknown If all of the above answers are "NO", then may proceed with Cephalosporin use.     Home Medications: Medications Prior to Admission  Medication Sig Dispense Refill Last Dose  . acetaminophen (TYLENOL) 500 MG tablet Take 1,000 mg by mouth daily as needed for mild pain.    prn at prn  . bimatoprost (LUMIGAN) 0.01 % SOLN Place 1 drop into both eyes at bedtime.    07/09/2018 at 2100  . dorzolamide-timolol (COSOPT) 22.3-6.8 MG/ML ophthalmic solution Place 1 drop into the right eye 2 (two) times daily.    07/09/2018 at 2100  . metroNIDAZOLE (METROCREAM) 0.75 % cream Apply 1 application topically daily. Apply to face   07/10/2018 at 0900  . Multiple Vitamin (MULTIVITAMIN WITH MINERALS) TABS tablet Take 1 tablet by mouth daily. 30 tablet 0 07/10/2018 at 0930  . omeprazole (PRILOSEC) 20 MG capsule Take 20 mg by mouth daily.    07/10/2018 at 0900  . potassium chloride (K-DUR,KLOR-CON) 10 MEQ tablet Take 10 mEq by mouth daily.   07/09/2018 at 0900  . PREMARIN vaginal cream Place 1 application vaginally 2 (two) times a week.    prn at prn  . fluconazole (DIFLUCAN) 100 MG tablet    Not Taking at Unknown time  . furosemide (LASIX) 20 MG tablet Take 1 tablet (20 mg total) by mouth 2 (two) times daily. (Patient not taking: Reported on 07/10/2018) 30 tablet 0 Not Taking at Unknown time  . tamsulosin (FLOMAX) 0.4 MG CAPS capsule Take 1 capsule (0.4 mg total) by mouth daily. (Patient not taking: Reported on 07/10/2018) 15 capsule 0 Not Taking at Unknown time   Home medication reconciliation was completed with the patient.   Scheduled Inpatient Medications:   . enoxaparin (LOVENOX)  injection  30 mg Subcutaneous Q24H  . latanoprost  1 drop Both Eyes QHS    Continuous Inpatient Infusions:   . sodium chloride 50 mL/hr at 07/11/18 0537    PRN Inpatient Medications:  acetaminophen **OR** acetaminophen, ondansetron **OR** ondansetron (ZOFRAN) IV  Family History: family history is not on file.  The patient's family history is negative for inflammatory bowel disorders, GI malignancy, or solid organ transplantation.  Social History:   reports that she has never smoked. She has never used smokeless tobacco. She reports that she does not drink alcohol or use drugs. The patient denies ETOH, tobacco, or drug use.   Review of Systems: Unable to obtain due to patient's mental status    Physical Examination: BP (!) 146/84 (BP Location: Left Arm)   Pulse 97   Temp (!) 97.5 F (36.4 C) (Oral)   Resp 18   Ht 5\' 3"  (1.6 m)   Wt 56.2 kg   SpO2 100%   BMI 21.97 kg/m  Gen: NAD, alert and oriented x 4 HEENT: PEERLA, EOMI, Neck: supple, no JVD or thyromegaly Chest: CTA bilaterally, no wheezes, crackles, or other adventitious sounds CV: RRR, no m/g/c/r Abd: very minimal  distention, tenderness to palpation in LUQ and epigastric regions, hypoactive BS in all four quadrants; no HSM, guarding, ridigity, or rebound tenderness Ext: no edema, well perfused with 2+ pulses, Skin: no rash or lesions noted Lymph: no LAD  Data: Lab Results  Component Value Date   WBC 7.8 07/11/2018   HGB 15.6 (H) 07/11/2018   HCT 47.9 (H) 07/11/2018   MCV 88.9 07/11/2018   PLT 259 07/11/2018   Recent Labs  Lab 07/10/18 1729 07/11/18 0415  HGB 16.1* 15.6*   Lab Results  Component Value Date   NA 140 07/11/2018   K 4.2 07/11/2018   CL 109 07/11/2018   CO2 25 07/11/2018   BUN 21 07/11/2018   CREATININE 1.11 (H) 07/11/2018   Lab Results  Component Value Date   ALT 17 07/10/2018   AST 25 07/10/2018   ALKPHOS 108 07/10/2018   BILITOT 0.7 07/10/2018   No results for input(s): APTT,  INR, PTT in the last 168 hours. Assessment/Plan:  84 y/o Caucasian female with a PMH of renal artery aneurysm, cardiomyopathy, chronic congestive heart failure, nephrolithiasis admitted for increasing shortness of breath, cough, and epigastric discomfort  1. Large hiatal hernia with organoaxial volvulus 2. Advanced age  - CT scan showing large hiatal hernia with entire stomach up in the chest and organo-axial volvulus.  - Unfortunately, from a GI standpoint there is not much of conservative management available as this is a surgical issue. Due to advances age with multiple comorbidities, pt and her family have refused any surgical interventions at this time. - Since stomach is the thoracic cavity, cannot consider endoscopic derotation or PEG placement at this time - She can increase her omeprazole to 40 mg daily to help with acid suppression - Try to avoid foods that cause acid build up - chocolate, caffeine, peppermint, tobacco, alcohol - After review of imaging and patient's clinical status, management of organo-axial volvulus is surgical and not conservative. Medical management would not be effective. Pt is not a candidate for endoscopic evaluation at this time. If acute obstruction occurs, would be a surgical emergency. - GI will sign off at this time    Thank you for the consult. Please call with questions or concerns.  Geanie Kenning, PA-C [place]

## 2018-07-11 NOTE — Progress Notes (Signed)
[facility] NAME: Kelly Mcbride    MR#:  [id number]  DATE OF BIRTH:  Oct 15, 1922  SUBJECTIVE:  CHIEF COMPLAINT:   Chief Complaint  Patient presents with  . Shortness of Breath  . Chest Pain  . Nausea   -Patient is pleasantly confused.  Has the NG tube for worsened hiatal hernia. -Complains of discomfort because of the NG tube.  REVIEW OF SYSTEMS:  Review of Systems  Unable to perform ROS: Dementia    DRUG ALLERGIES:   Allergies  Allergen Reactions  . Nitrofurantoin Nausea And Vomiting  . Sulfa Antibiotics Hives and Rash  . Aspirin Other (See Comments)    Reaction: feels like she is having a heart attack/ because of hiatal hernia  . Ciprofloxacin Other (See Comments)    Other reaction(s): Other (See Comments) Per patient she get upset stomach and dizziness.   . Ibuprofen Other (See Comments)    Other reaction(s): Other (See Comments) Because of hiatal hernia   . Penicillins Other (See Comments)    Has patient had a PCN reaction causing immediate rash, facial/tongue/throat swelling, SOB or lightheadedness with hypotension: Unknown Has patient had a PCN reaction causing severe rash involving mucus membranes or skin necrosis: Unknown Has patient had a PCN reaction that required hospitalization: Unknown Has patient had a PCN reaction occurring within the last 10 years: Unknown If all of the above answers are "NO", then may proceed with Cephalosporin use.     VITALS:  Blood pressure (!) 146/84, pulse 97, temperature (!) 97.5 F (36.4 C), temperature source Oral, resp. rate 18, height 5\' 3"  (1.6 m), weight 56.2 kg, SpO2 100 %.  PHYSICAL EXAMINATION:  Physical Exam   GENERAL:  84 y.o.-year-old lady patient lying in the bed with no acute distress.  EYES: Pupils equal, round, reactive to light and accommodation. No scleral icterus. Extraocular muscles intact.  HEENT: Head atraumatic, normocephalic. Oropharynx and  nasopharynx clear. NG tube in place NECK:  Supple, no jugular venous distention. No thyroid enlargement, no tenderness.  LUNGS: Normal breath sounds bilaterally, no wheezing, rales,rhonchi or crepitation. No use of accessory muscles of respiration.  Decreased bibasilar breath sounds CARDIOVASCULAR: S1, S2 normal. No  rubs, or gallops. 3/6 systolic murmur present ABDOMEN: Soft, nontender, nondistended. Bowel sounds present. No organomegaly or mass.  EXTREMITIES: No pedal edema, cyanosis, or clubbing.  NEUROLOGIC: Cranial nerves II through XII are intact. Muscle strength 5/5 in all extremities. Sensation intact. Gait not checked. Global weakness noted. PSYCHIATRIC: The patient is alert and oriented x 1  SKIN: No obvious rash, lesion, or ulcer.    LABORATORY PANEL:   CBC Recent Labs  Lab 07/11/18 0415  WBC 7.8  HGB 15.6*  HCT 47.9*  PLT 259   ------------------------------------------------------------------------------------------------------------------  Chemistries  Recent Labs  Lab 07/10/18 1729 07/11/18 0415  NA 138 140  K 4.8 4.2  CL 105 109  CO2 27 25  GLUCOSE 161* 114*  BUN 22 21  CREATININE 1.34* 1.11*  CALCIUM 10.2 10.1  AST 25  --   ALT 17  --   ALKPHOS 108  --   BILITOT 0.7  --    ------------------------------------------------------------------------------------------------------------------  Cardiac Enzymes Recent Labs  Lab 07/10/18 1729  TROPONINI <0.03   ------------------------------------------------------------------------------------------------------------------  RADIOLOGY:  Ct Chest Wo Contrast  Result Date: 07/10/2018 CLINICAL DATA:  Shortness of breath, epigastric abdominal pain and nausea for several days. EXAM: CT CHEST WITHOUT CONTRAST TECHNIQUE: Multidetector CT imaging of the  chest was performed following the standard protocol without IV contrast. COMPARISON:  Abdominal CT scan 02/28/2018 FINDINGS: Cardiovascular: The heart is normal  in size. No pericardial effusion. There is tortuosity, ectasia and elongation of the thoracic aorta but no focal aneurysm. Scattered calcifications. Mediastinum/Nodes: No mediastinal or hilar mass or lymphadenopathy. Patient has the extremely large hiatal hernia. The entire stomach is up in the chest along with the transverse colon. Organo-axial volvulus of the stomach which is moderately distended. I do not seen E obvious pneumatosis to suggest ischemia. The second portion the duodenum is slightly narrowed is a comes through the hiatal hernia but no do not see an obvious obstruction. Colon is not obstructed. I do not see any abnormal fat to suggest incarceration. Lungs/Pleura: There are small bilateral pleural effusions noted. No infiltrates or mass. Upper Abdomen: Right adrenal gland adenoma is noted. 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 Right renal calculi are noted. Left-sided parapelvic cysts and calculi. Musculoskeletal: No acute bony findings. Advanced degenerative changes involving the thoracic spine and osteoporosis. IMPRESSION: 1. Large hiatal hernia with the entire stomach up in the chest and organo-axial volvulus. Moderate distention of the stomach is noted and there could be a partial gastric outlet obstruction but I do not see any pneumatosis or significant gastric wall thickening to suggest an ischemic process. Gastroparesis is also possible. 2. Small bilateral pleural effusions and overlying atelectasis but no definite infiltrates or edema. 3. 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 Aortic Atherosclerosis (ICD10-I70.0). Electronically Signed   By: Marijo Sanes M.D.   On: 07/10/2018 19:34    EKG:   Orders placed or performed during the hospital encounter of 07/10/18  . ED EKG within 10 minutes  . ED EKG within 10 minutes  . EKG 12-Lead  . EKG 12-Lead  . EKG    ASSESSMENT AND PLAN:   84 year old female with past medical history significant for CHF, cardiomyopathy,  history of breast cancer presents to hospital from home secondary to worsening cough and upper abdominal discomfort.  1.  Large hiatal hernia-likely cause of chest discomfort and shortness of breath -CT of the chest done showing small effusions and atelectasis however or large hiatal hernia with the entire stomach up in the chest and the possible axial volvulus noted. -Concern for partial gastric outlet obstruction noted. -General surgery consult is appreciated.  Patient is not a surgical candidate.  They have recommended conservative management -Currently has an NG tube but states it is very uncomfortable -GI consult for any minimally invasive procedures or conservative management. -If no options, strongly consider palliative care and hospice evaluation.  2.  Glaucoma-restart home drops  3.  GERD-start on IV Protonix daily  4.  DVT prophylaxis-on Lovenox  Other oral meds are on hold for now After palliative care input, consider PT consult if necessary     All the records are reviewed and case discussed with Care Management/Social Workerr. Management plans discussed with the patient, family and they are in agreement.  CODE STATUS: DNR  TOTAL TIME TAKING CARE OF THIS PATIENT: 38 minutes.   POSSIBLE D/C IN 1-2 DAYS, DEPENDING ON CLINICAL CONDITION.   Gladstone Lighter M.D on 07/11/2018 at 2:34 PM  Between 7am to 6pm - Pager - [phone]  After 6pm go to [email or web address] - password [email or web address] [facility]  Office  [phone]  CC: Primary care physician; Albina Billet, MD

## 2018-07-11 NOTE — Progress Notes (Signed)
Pts NG tube clamped per Dr. Glenford Peers order.

## 2018-07-11 NOTE — Plan of Care (Signed)
Was contacted by Dr. Alice Reichert, requesting another evaluation by surgery.  Patient seen with daughter at bedside. They still do not want surgical intervention and accept that this may be a life-ending event, if she truly has a gastric volvulus.  The patient herself, appears quite well, considering the situation.  She is not acting clinically like there is any tissue compromise.  Her NGT has had very little output, aside from the initial 700 cc upon placement. I confirmed that the tube is functioning correctly.  As no operative intervention is desired and the NGT is causing significant discomfort, I recommend a 4 hour clamping trial. If patient tolerates trial and post-clamping gastric residual is 150cc or less, would remove NGT and offer sips of clear liquids to assess tolerance.

## 2018-07-12 LAB — BASIC METABOLIC PANEL
ANION GAP: 6 (ref 5–15)
BUN: 23 mg/dL (ref 8–23)
CO2: 24 mmol/L (ref 22–32)
Calcium: 9.7 mg/dL (ref 8.9–10.3)
Chloride: 112 mmol/L — ABNORMAL HIGH (ref 98–111)
Creatinine, Ser: 1.22 mg/dL — ABNORMAL HIGH (ref 0.44–1.00)
GFR calc non Af Amer: 38 mL/min — ABNORMAL LOW (ref >60)
GFR, EST AFRICAN AMERICAN: 44 mL/min — AB (ref >60)
Glucose, Bld: 86 mg/dL (ref 70–99)
Potassium: 4.1 mmol/L (ref 3.5–5.1)
Sodium: 142 mmol/L (ref 135–145)

## 2018-07-12 MED ORDER — PANTOPRAZOLE SODIUM 40 MG PO TBEC
40.0000 mg | DELAYED_RELEASE_TABLET | Freq: Every day | ORAL | Status: DC
  Administered 2018-07-12 – 2018-07-13 (×2): 40 mg via ORAL
  Filled 2018-07-12 (×2): qty 1

## 2018-07-12 NOTE — Clinical Social Work Note (Signed)
Clinical Social Work Assessment  Patient Details  Name: Kelly Mcbride MRN: [id number] Date of Birth: 1923-05-05  Date of referral:  07/12/18               Reason for consult:  End of Life/Hospice                Permission sought to share information with:  Family Supports Permission granted to share information::  Yes, Verbal Permission Granted  Name::        Agency::     Relationship::     Contact Information:     Housing/Transportation Living arrangements for the past 2 months:  Single Family Home Source of Information:  Patient, Adult Children Patient Interpreter Needed:  None Criminal Activity/Legal Involvement Pertinent to Current Situation/Hospitalization:  No - Comment as needed Significant Relationships:  Adult Children Lives with:  Adult Children Do you feel safe going back to the place where you live?  Yes Need for family participation in patient care:  Yes (Comment)  Care giving concerns:  Patient resides with daughter and her husband.  Social Worker assessment / plan:  CSW met with patient and patient's daughter: Desma Maxim: [phone] to discuss discharge planning. Palliative Care had mentioned to the RN CM that patient and daughter want hospice. CSW explained role and purpose of visit to patient and daughter and offered hospice agency choice. Patient's daughter stated that they want hospice of [place] [facility]. Referral made to [facility] with [place].   Employment status:    Insurance informationEducational psychologist PT Recommendations:    Information / Referral to community resources:     Patient/Family's Response to care:  Patient's daughter expressed appreciation for CSW assistance.  Patient/Family's Understanding of and Emotional Response to Diagnosis, Current Treatment, and Prognosis:  Patient and daughter were calm and accepting.   Emotional Assessment Appearance:  Appears stated age Attitude/Demeanor/Rapport:  (pleasant ) Affect (typically  observed):    Orientation:  Oriented to Self, Oriented to Place Alcohol / Substance use:  Not Applicable Psych involvement (Current and /or in the community):  No (Comment)  Discharge Needs  Concerns to be addressed:  Care Coordination Readmission within the last 30 days:  No Current discharge risk:  None Barriers to Discharge:  No Barriers Identified   Shela Leff, LCSW 07/12/2018, 2:54 PM

## 2018-07-12 NOTE — Progress Notes (Signed)
Less than 50cc gastric residual assessed after 4 hour clamping trial. RN removed NGT as ordered.

## 2018-07-12 NOTE — Care Management (Signed)
RNCM consult for outpatient hospice.  CSW has met with patient and family and coordinated.  Anticipate discharge tomorrow

## 2018-07-12 NOTE — Consult Note (Signed)
Consultation Note Date: 07/12/2018   Patient Name: Kelly Mcbride  DOB: 07-15-1923  MRN: [id number]  Age / Sex: 84 y.o., female  PCP: Albina Billet, MD Referring Physician: Saundra Shelling, MD  Reason for Consultation: Establishing goals of care  HPI/Patient Profile: 84 y.o. female  admitted on 07/10/2018 from home with complaints of chest pain and shortness of breath. She has a past medical history of dementia, cardiomyopathy, CHF, hiatal hernia, renal artery aneurysm, nephrolithiasis, breast cancer, mixed urge and stress incontinence, and knee/lower back pain.  During her ED course patient reported difficulty breathing for several days prior to admission.  This was also accommodated by increased cough and sputum production and abdominal discomfort.  Patient reported feelings of bloating.  CT chest and abdomen revealed a large hiatal hernia.  Patient was seen by surgery and family declined surgical intervention.  Conservative management initiated with NG to low wall suctioning. Since admission patient has been seen by GI and surgery and family continues to decline surgical intervention with awareness of life-threatening risk.  Patient continues with NG to low wall suction.  Palliative medicine team consulted for goals of care.  Clinical Assessment and Goals of Care: I have reviewed medical records including lab results, imaging, Epic notes, and MAR, received report from the bedside RN, and assessed the patient. I met at the bedside with patient and her daughter/POA Kelly Mcbride to discuss diagnosis prognosis, GOC, EOL wishes, disposition and options.  Is alert to self and daughter only.  She is unable to engage in goals of care discussion.   I introduced Palliative Medicine as specialized medical care for people living with serious illness. It focuses on providing relief from the symptoms and stress of a serious  illness. The goal is to improve quality of life for both the patient and the family.  Patient was able to discuss some of her life history.  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  Daughter reports she dressed up as a Designer, multimedia and work at [employer], events, and did private shows up until 5 years ago.  Patient is also a retired Development worker, community.  She also played the fiddle.   Patient moved in with her daughter about a year and a half ago after a noticeable change in her health and repetitive falls due to advancing dementia.  Prior to that she lived in an apartment alone for more than 10 years. Daughter reports she had caregivers coming into the home with lots of notes and signs to guide her daily prior to moving out of her home. Family reports since patient has moved in with daughter her health has continued to decline. Daughter reports over 25lb weight loss, decreased mobility, and increasing hallucinations and changes in her mental state. She does however, remember certain life events and able to recognize family. Daughter reports over the past months she has noticed a decrease in her cognition and she would often have frequent  spells lasting for days and then suddenly ceasing, where patient would babble on continuous or hold conversations with noone. She is able to feed self and assist with ADLs such as bathing although daughter reports slower in pace. She is ambulatory with assistance and walker.   We discussed her current illness and what it means in the larger context of her on-going co-morbidities. We discussed specifically patients ongoing dementia, hiatal hernia and risk of non-surgical interventions, and her overall functional and nutritional status.  Natural disease trajectory and expectations at EOL were also discussed. Daughter verbalized understanding.   Expressed her mother's wishes and her awareness of her current condition.   States she has noticed a decline in her mother and so has her other providers.  She reports her PCP requested hospice/palliative services back in October and because her mom seem to have a improvement in her condition she did not follow through.  He now states that she knows that hospice should have been involved previously and that although her mom was having better days she knows that that is like a continuous cycle of ups and downs.  Mother is somewhat tearful and states that she fell with her mom not wanting to eat or drink and her increasing weakness and lethargy that this was only continue with signs of her advancement of dementia.  She expresses family decisions not to proceed with surgical interventions knowing that her mother's quality of life prior to this event has only declined and would not be conducive to what she would want post surgery.  Support given.  I attempted to elicit values and goals of care important to the patient family.  Daughter expresses their goal is to continue to treat the treatable with noninvasive medical interventions with hopes of getting her mother home with hospice services and awareness that this will most likely be an end-of-life process.  The difference between aggressive medical intervention and comfort care was considered in light of the patient's goals of care. I educated patient/family on what comfort care measures would look like. I discussed with them with comfort care, patient would no longer receive aggressive medical interventions such as continuous vital signs, lab work, radiology testing, or medications not focused on comfort. All care will focus on how the patient is looking and feeling. This will include management of any symptoms that may cause discomfort, pain, shortness of breath, cough, nausea, agitation, anxiety, and/or secretions etc. Symptoms can be managed with medications and other non-pharmacological interventions such as spiritual support if  requested, repositioning, music therapy, or therapeutic listening. Family verbalized understanding and appreciation.    Daughter expresses her wishes are to return home with outpatient hospice services.  Daughter confirms patient's DNR/DNI status.   Hospice and Palliative Care services outpatient were explained and offered.  Daughters expressed goals and mention wishes of her mother recommendations were given for outpatient hospice.  Daughter verbalized  understanding and awareness of the goals and philosophy of hospice care and wishes to have their services once patient is discharged.   Questions and concerns were addressed.  Hard Choices booklet left for review. The family was encouraged to call with questions or concerns.  PMT will continue to support holistically.  Primary Decision Maker:  HCPOA/DAUGHTER: TRACY JOHNSON    SUMMARY OF RECOMMENDATIONS    DNR/DNI-as confirmed by daughter/POA  Continue to treat the treatable with conservative medical management, no escalation of care  Daughter verbalizes understanding of patient's current condition and request for patient to return home with  outpatient hospice services.  She declined surgical interventions.  Case management referral for outpatient hospice services.  Daughter states they currently have a hospital bed.  Palliative medicine team will continue to support patient, patient's family, and medical team during hospitalization.  Code Status/Advance Care Planning:  DNR   Palliative Prophylaxis:   Aspiration, Bowel Regimen, Delirium Protocol, Frequent Pain Assessment and Oral Care  Additional Recommendations (Limitations, Scope, Preferences):  Full Scope Treatment and No Surgical Procedures-Continue to treat the treatable without escalation of care  Psycho-social/Spiritual:   Desire for further Chaplaincy support:NO   Prognosis:   < 6 months in the setting of large hiatal hernia/vulvulas with family declining surgical  intervention, CHF, renal artery aneurysm, history of breast cancer, CHF, poor p.o. intake, weight loss, falls, decreased mobility, and advanced dementia.  Discharge Planning: Home with Hospice      Primary Diagnoses: Present on Admission: **None**   I have reviewed the medical record, interviewed the patient and family, and examined the patient. The following aspects are pertinent.  Past Medical History:  Diagnosis Date  . Aneurysm of renal artery ([place]) 01/10/2017  . 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 Breast cancer ([place]) 01/10/2017  . Cardiomyopathy, secondary ([place]) 06/07/2017  . CHF (congestive heart failure) ([place]) 06/07/2017  . Incomplete emptying of bladder 01/29/2014  . Kidney cyst, acquired 01/10/2017  . Knee pain 04/28/2016  . Low back pain 09/25/2012  . Mixed urge and stress incontinence 09/25/2012  . Nephrolithiasis 01/10/2017   Social History   Socioeconomic History  . Marital status: Widowed    Spouse name: Not on file  . Number of children: Not on file  . Years of education: Not on file  . Highest education level: Not on file  Occupational History  . Not on file  Social Needs  . Financial resource strain: Not on file  . Food insecurity:    Worry: Not on file    Inability: Not on file  . Transportation needs:    Medical: Not on file    Non-medical: Not on file  Tobacco Use  . Smoking status: Never Smoker  . Smokeless tobacco: Never Used  Substance and Sexual Activity  . Alcohol use: No  . Drug use: No  . Sexual activity: Not Currently  Lifestyle  . Physical activity:    Days per week: Not on file    Minutes per session: Not on file  . Stress: Not on file  Relationships  . Social connections:    Talks on phone: Not on file    Gets together: Not on file    Attends religious service: Not on file    Active member of club or organization: Not on file    Attends meetings of clubs or organizations: Not on file    Relationship status: Not on file    Other Topics Concern  . Not on file  Social History Narrative  . Not on file   History reviewed. No pertinent family history. Scheduled Meds: . dorzolamide-timolol  1 drop Right Eye BID  . enoxaparin (LOVENOX) injection  30 mg Subcutaneous Q24H  . latanoprost  1 drop Both Eyes QHS  . pantoprazole (PROTONIX) IV  40 mg Intravenous Q24H   Continuous Infusions: . sodium chloride 50 mL/hr at 07/12/18 0521   PRN Meds:.acetaminophen **OR** acetaminophen, ondansetron **OR** ondansetron (ZOFRAN) IV Medications Prior to Admission:  Prior to Admission medications   Medication Sig Start Date End Date Taking? Authorizing Provider  acetaminophen (TYLENOL) 500 MG  tablet Take 1,000 mg by mouth daily as needed for mild pain.    Yes [provider]  bimatoprost (LUMIGAN) 0.01 % SOLN Place 1 drop into both eyes at bedtime.    Yes [provider]  dorzolamide-timolol (COSOPT) 22.3-6.8 MG/ML ophthalmic solution Place 1 drop into the right eye 2 (two) times daily.  06/07/17  Yes [provider]  metroNIDAZOLE (METROCREAM) 0.75 % cream Apply 1 application topically daily. Apply to face 01/15/18  Yes [provider]  Multiple Vitamin (MULTIVITAMIN WITH MINERALS) TABS tablet Take 1 tablet by mouth daily. 11/09/17  Yes Wieting, Richard, MD  omeprazole (PRILOSEC) 20 MG capsule Take 20 mg by mouth daily.  11/01/13  Yes [provider]  potassium chloride (K-DUR,KLOR-CON) 10 MEQ tablet Take 10 mEq by mouth daily. 02/13/18  Yes [provider]  PREMARIN vaginal cream Place 1 application vaginally 2 (two) times a week.  02/21/18  Yes [provider]  fluconazole (DIFLUCAN) 100 MG tablet  03/13/18   [provider]  furosemide (LASIX) 20 MG tablet Take 1 tablet (20 mg total) by mouth 2 (two) times daily. Patient not taking: Reported on 07/10/2018 03/04/18   Dustin Flock, MD  tamsulosin (FLOMAX) 0.4 MG CAPS capsule Take 1 capsule (0.4 mg total) by  mouth daily. Patient not taking: Reported on 07/10/2018 02/28/18   Orbie Pyo, MD   Allergies  Allergen Reactions  . Nitrofurantoin Nausea And Vomiting  . Sulfa Antibiotics Hives and Rash  . Aspirin Other (See Comments)    Reaction: feels like she is having a heart attack/ because of hiatal hernia  . Ciprofloxacin Other (See Comments)    Other reaction(s): Other (See Comments) Per patient she get upset stomach and dizziness.   . Ibuprofen Other (See Comments)    Other reaction(s): Other (See Comments) Because of hiatal hernia   . Penicillins Other (See Comments)    Has patient had a PCN reaction causing immediate rash, facial/tongue/throat swelling, SOB or lightheadedness with hypotension: Unknown Has patient had a PCN reaction causing severe rash involving mucus membranes or skin necrosis: Unknown Has patient had a PCN reaction that required hospitalization: Unknown Has patient had a PCN reaction occurring within the last 10 years: Unknown If all of the above answers are "NO", then may proceed with Cephalosporin use.    Review of Systems  Unable to perform ROS: Dementia    Physical Exam Vitals signs and nursing note reviewed.  Constitutional:      Appearance: She is underweight. She is ill-appearing.     Comments: Thin, frail, chronically ill appearing   Cardiovascular:     Rate and Rhythm: Normal rate and regular rhythm.     Pulses: Normal pulses.     Heart sounds: Normal heart sounds.  Pulmonary:     Effort: Pulmonary effort is normal.     Breath sounds: Decreased breath sounds present.  Chest:     Comments: barrel Abdominal:     General: Bowel sounds are decreased.     Palpations: Abdomen is soft.     Hernia: A hernia is present.     Comments: hiatal  Skin:    General: Skin is warm and dry.     Comments: Thin   Neurological:     Mental Status: She is alert. She is confused.     Comments: Alert to self and daughter, hx of dementia   Psychiatric:          Behavior: Behavior is cooperative.  Cognition and Memory: Cognition is impaired. Memory is impaired.        Judgment: Judgment is inappropriate.     Vital Signs: BP 140/85 (BP Location: Left Arm)   Pulse 98   Temp 98 F (36.7 C) (Oral)   Resp 20   Ht _0  (1.6 m)   Wt 56.2 kg   SpO2 95%   BMI 21.97 kg/m  Pain Scale: 0-10   Pain Score: 0-No pain   SpO2: SpO2: 95 % O2 Device:SpO2: 95 % O2 Flow Rate: .   IO: Intake/output summary:   Intake/Output Summary (Last 24 hours) at 07/12/2018 1048 Last data filed at 07/12/2018 9412 Gross per 24 hour  Intake 1183.01 ml  Output 0 ml  Net 1183.01 ml    LBM: Last BM Date: 07/10/18 Baseline Weight: Weight: 56.2 kg Most recent weight: Weight: 56.2 kg     Palliative Assessment/Data: PPS 20% NG/NPO    Flowsheet Rows     Most Recent Value  Intake Tab  Referral Department  Hospitalist  Unit at Time of Referral  ER  Palliative Care Primary Diagnosis  Cancer  Date Notified  07/11/18  Palliative Care Type  New Palliative care  Reason for referral  Clarify Goals of Care  Date of Admission  07/10/18  # of days IP prior to Palliative referral  1  Clinical Assessment  Psychosocial & Spiritual Assessment  Palliative Care Outcomes      Time In: 1630 Time Out: 1735 Time Total: 65 min.   Greater than 50%  of this time was spent counseling and coordinating care related to the above assessment and plan.  Signed by:  Alda Lea, AGPCNP-BC Palliative Medicine Team  Phone: [phone] Fax: [phone] Pager: [phone] Amion: Bjorn Pippin    Please contact Palliative Medicine Team phone at [phone] for questions and concerns.  For individual provider: See Shea Evans

## 2018-07-12 NOTE — Progress Notes (Signed)
Daily Progress Note   Patient Name: Kelly Mcbride       Date: 07/12/2018 DOB: Feb 02, 1923  Age: 84 y.o. MRN#: [id number] Attending Physician: Saundra Shelling, MD Primary Care Physician: Albina Billet, MD Admit Date: 07/10/2018  Reason for Consultation/Follow-up: Establishing goals of care  Subjective: Patient awake and alert. She is oriented to self only. Pointing at the wall. She is pleasant. NG tube removed and she appears to be tolerating without evidence of pain, distress, or nausea/vomiting. Patient denies pain when asked.   Daughter at the bedside and reports patient tolerated her liquid lunch without complications. She reported she ate her [place] ice, and had a few sips of broth and drink. Briefly reviewed our goals of care discussion on yesterday. Daughter verbalized she did not have any questions and goals remain for patient to be discharged home as soon as she can with outpatient hospice services. Daughter express she will also have hired caregivers for additional support.   Length of Stay: 2  Current Medications: Scheduled Meds:  . dorzolamide-timolol  1 drop Right Eye BID  . enoxaparin (LOVENOX) injection  30 mg Subcutaneous Q24H  . latanoprost  1 drop Both Eyes QHS  . pantoprazole  40 mg Oral Daily    Continuous Infusions: . sodium chloride 50 mL/hr at 07/12/18 0521    PRN Meds: acetaminophen **OR** acetaminophen, ondansetron **OR** ondansetron (ZOFRAN) IV  Physical Exam Vitals signs and nursing note reviewed.  Constitutional:      Comments: Thin, frail   Cardiovascular:     Rate and Rhythm: Normal rate and regular rhythm.     Pulses: Normal pulses.     Heart sounds: Normal heart sounds.  Pulmonary:     Effort: Pulmonary effort is normal.     Breath sounds:  Decreased breath sounds present.  Skin:    General: Skin is warm and dry.  Neurological:     Mental Status: She is alert. She is confused.     Motor: Weakness and atrophy present.     Comments: Alert to self only   Psychiatric:        Cognition and Memory: Cognition is impaired. Memory is impaired.        Judgment: Judgment is inappropriate.             Vital Signs: BP  133/72 (BP Location: Right Arm)   Pulse 84   Temp 98.6 F (37 C) (Oral)   Resp 20   Ht 5\' 3"  (1.6 m)   Wt 56.2 kg   SpO2 99%   BMI 21.97 kg/m  SpO2: SpO2: 99 % O2 Device: O2 Device: Room Air O2 Flow Rate:    Intake/output summary:   Intake/Output Summary (Last 24 hours) at 07/12/2018 1514 Last data filed at 07/12/2018 1300 Gross per 24 hour  Intake 1433.01 ml  Output 0 ml  Net 1433.01 ml   LBM: Last BM Date: 07/10/18 Baseline Weight: Weight: 56.2 kg Most recent weight: Weight: 56.2 kg       Palliative Assessment/Data: PPS 30%    Flowsheet Rows     Most Recent Value  Intake Tab  Referral Department  Hospitalist  Unit at Time of Referral  ER  Palliative Care Primary Diagnosis  Cancer  Date Notified  07/11/18  Palliative Care Type  New Palliative care  Reason for referral  Clarify Goals of Care  Date of Admission  07/10/18  # of days IP prior to Palliative referral  1  Clinical Assessment  Psychosocial & Spiritual Assessment  Palliative Care Outcomes      Patient Active Problem List   Diagnosis Date Noted  . Hiatal hernia 07/10/2018  . GERD (gastroesophageal reflux disease) 03/02/2018  . HLD (hyperlipidemia) 03/02/2018  . Malnutrition of moderate degree 11/08/2017  . Cardiomyopathy, secondary ([place]) 06/07/2017  . Acute on chronic diastolic CHF (congestive heart failure) ([place]) 06/07/2017  . Calculus of ureter 06/07/2017  . Nephrolithiasis 01/10/2017  . Kidney cyst, acquired 01/10/2017  . Aneurysm of renal artery ([place]) 01/10/2017  . Breast cancer ([place]) 01/10/2017  . History of  appendectomy 01/10/2017  . Knee pain 04/28/2016  . Incomplete emptying of bladder 01/29/2014  . 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 Low back pain 09/25/2012  . Mixed urge and stress incontinence 09/25/2012    Palliative Care Assessment & Plan   Patient Profile: 84 y.o. female  admitted on 07/10/2018 from home with complaints of chest pain and shortness of breath. She has a past medical history of dementia, cardiomyopathy, CHF, hiatal hernia, renal artery aneurysm, nephrolithiasis, breast cancer, mixed urge and stress incontinence, and knee/lower back pain.  During her ED course patient reported difficulty breathing for several days prior to admission.  This was also accommodated by increased cough and sputum production and abdominal discomfort.  Patient reported feelings of bloating.  CT chest and abdomen revealed a large hiatal hernia.  Patient was seen by surgery and family declined surgical intervention.  Conservative management initiated with NG to low wall suctioning. Since admission patient has been seen by GI and surgery and family continues to decline surgical intervention with awareness of life-threatening risk.  Patient continues with NG to low wall suction.  Palliative medicine team consulted for goals of care.   Recommendations/Plan:  DNR/DNI  Home with outpatient hospice and hired caregivers  Case Manager aware of outpatient hospice need at discharge  PMT will continue to support and follow.   Goals of Care and Additional Recommendations:  Limitations on Scope of Treatment: Full Scope Treatment  Code Status:    Code Status Orders  (From admission, onward)         Start     Ordered   07/10/18 2224  Do not attempt resuscitation (DNR)  Continuous    Question Answer Comment  In the event of cardiac or respiratory ARREST Do  not call a "code blue"   In the event of cardiac or respiratory ARREST Do not perform Intubation, CPR, defibrillation or ACLS   In the  event of cardiac or respiratory ARREST Use medication by any route, position, wound care, and other measures to relive pain and suffering. May use oxygen, suction and manual treatment of airway obstruction as needed for comfort.   Comments nurse may pronounce      07/10/18 2223        Code Status History    Date Active Date Inactive Code Status Order ID Comments User Context   03/03/2018 0147 03/04/2018 1717 DNR [id number]  Lance Coon, MD Inpatient   11/08/2017 1016 11/09/2017 1829 DNR [id number]  Loletha Grayer, MD Inpatient   11/07/2017 2231 11/08/2017 1016 Full Code [id number]  Amelia Jo, MD ED      Prognosis:   POOR  Discharge Planning:  Home with Hospice  Care plan was discussed with patient's family, bedside RN, and [place], CM.   Thank you for allowing the Palliative Medicine Team to assist in the care of this patient.   Total Time 35 min Prolonged Time Billed  NO        Greater than 50%  of this time was spent counseling and coordinating care related to the above assessment and plan.   Alda Lea, AGPCNP-BC Palliative Medicine Team  Pager: [phone] Amion: Bjorn Pippin   Please contact Palliative Medicine Team phone at [phone] for questions and concerns.

## 2018-07-12 NOTE — Progress Notes (Signed)
[facility] [place] NAME: Kelly Mcbride    MR#:  [id number]  DATE OF BIRTH:  1923-04-26  SUBJECTIVE:  CHIEF COMPLAINT:   Chief Complaint  Patient presents with  . Shortness of Breath  . Chest Pain  . Nausea   -Patient is pleasantly confused.   Patient has removed the NG tube Feels hungry  REVIEW OF SYSTEMS:  Review of Systems  Unable to perform ROS: Dementia    DRUG ALLERGIES:   Allergies  Allergen Reactions  . Nitrofurantoin Nausea And Vomiting  . Sulfa Antibiotics Hives and Rash  . Aspirin Other (See Comments)    Reaction: feels like she is having a heart attack/ because of hiatal hernia  . Ciprofloxacin Other (See Comments)    Other reaction(s): Other (See Comments) Per patient she get upset stomach and dizziness.   . Ibuprofen Other (See Comments)    Other reaction(s): Other (See Comments) Because of hiatal hernia   . Penicillins Other (See Comments)    Has patient had a PCN reaction causing immediate rash, facial/tongue/throat swelling, SOB or lightheadedness with hypotension: Unknown Has patient had a PCN reaction causing severe rash involving mucus membranes or skin necrosis: Unknown Has patient had a PCN reaction that required hospitalization: Unknown Has patient had a PCN reaction occurring within the last 10 years: Unknown If all of the above answers are "NO", then may proceed with Cephalosporin use.     VITALS:  Blood pressure 133/72, pulse 84, temperature 98.6 F (37 C), temperature source Oral, resp. rate 20, height 5\' 3"  (1.6 m), weight 56.2 kg, SpO2 99 %.  PHYSICAL EXAMINATION:  Physical Exam   GENERAL:  84 y.o.-year-old lady patient lying in the bed with no acute distress.  EYES: Pupils equal, round, reactive to light and accommodation. No scleral icterus. Extraocular muscles intact.  HEENT: Head atraumatic, normocephalic. Oropharynx and nasopharynx clear. NECK:  Supple, no jugular venous distention.  No thyroid enlargement, no tenderness.  LUNGS: Normal breath sounds bilaterally, no wheezing, rales,rhonchi or crepitation. No use of accessory muscles of respiration.  Decreased bibasilar breath sounds CARDIOVASCULAR: S1, S2 normal. No  rubs, or gallops. 3/6 systolic murmur present ABDOMEN: Soft, nontender, nondistended. Bowel sounds present. No organomegaly or mass.  EXTREMITIES: No pedal edema, cyanosis, or clubbing.  NEUROLOGIC: Cranial nerves II through XII are intact. Muscle strength 5/5 in all extremities. Sensation intact. Gait not checked. Global weakness noted. PSYCHIATRIC: The patient is alert and oriented x 1  SKIN: No obvious rash, lesion, or ulcer.    LABORATORY PANEL:   CBC Recent Labs  Lab 07/11/18 0415  WBC 7.8  HGB 15.6*  HCT 47.9*  PLT 259   ------------------------------------------------------------------------------------------------------------------  Chemistries  Recent Labs  Lab 07/10/18 1729  07/12/18 0509  NA 138   < > 142  K 4.8   < > 4.1  CL 105   < > 112*  CO2 27   < > 24  GLUCOSE 161*   < > 86  BUN 22   < > 23  CREATININE 1.34*   < > 1.22*  CALCIUM 10.2   < > 9.7  AST 25  --   --   ALT 17  --   --   ALKPHOS 108  --   --   BILITOT 0.7  --   --    < > = values in this interval not displayed.   ------------------------------------------------------------------------------------------------------------------  Cardiac Enzymes Recent Labs  Lab 07/10/18  [place] <0.03   ------------------------------------------------------------------------------------------------------------------  RADIOLOGY:  Ct Chest Wo Contrast  Result Date: 07/10/2018 CLINICAL DATA:  Shortness of breath, epigastric abdominal pain and nausea for several days. EXAM: CT CHEST WITHOUT CONTRAST TECHNIQUE: Multidetector CT imaging of the chest was performed following the standard protocol without IV contrast. COMPARISON:  Abdominal CT scan 02/28/2018 FINDINGS:  Cardiovascular: The heart is normal in size. No pericardial effusion. There is tortuosity, ectasia and elongation of the thoracic aorta but no focal aneurysm. Scattered calcifications. Mediastinum/Nodes: No mediastinal or hilar mass or lymphadenopathy. Patient has the extremely large hiatal hernia. The entire stomach is up in the chest along with the transverse colon. Organo-axial volvulus of the stomach which is moderately distended. I do not seen E obvious pneumatosis to suggest ischemia. The second portion the duodenum is slightly narrowed is a comes through the hiatal hernia but no do not see an obvious obstruction. Colon is not obstructed. I do not see any abnormal fat to suggest incarceration. Lungs/Pleura: There are small bilateral pleural effusions noted. No infiltrates or mass. Upper Abdomen: Right adrenal gland adenoma is noted. 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 Right renal calculi are noted. Left-sided parapelvic cysts and calculi. Musculoskeletal: No acute bony findings. Advanced degenerative changes involving the thoracic spine and osteoporosis. IMPRESSION: 1. Large hiatal hernia with the entire stomach up in the chest and organo-axial volvulus. Moderate distention of the stomach is noted and there could be a partial gastric outlet obstruction but I do not see any pneumatosis or significant gastric wall thickening to suggest an ischemic process. Gastroparesis is also possible. 2. Small bilateral pleural effusions and overlying atelectasis but no definite infiltrates or edema. 3. 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 Aortic Atherosclerosis (ICD10-I70.0). Electronically Signed   By: Marijo Sanes M.D.   On: 07/10/2018 19:34    EKG:   Orders placed or performed during the hospital encounter of 07/10/18  . ED EKG within 10 minutes  . ED EKG within 10 minutes  . EKG 12-Lead  . EKG 12-Lead  . EKG    ASSESSMENT AND PLAN:   84 year old female with past medical history  significant for CHF, cardiomyopathy, history of breast cancer presents to hospital from home secondary to worsening cough and upper abdominal discomfort.  1.  Large hiatal hernia-likely cause of chest discomfort and shortness of breath -CT of the chest done showing small effusions and atelectasis however or large hiatal hernia with the entire stomach up in the chest and the possible axial volvulus noted. -Concern for partial gastric outlet obstruction noted. -General surgery consult is appreciated.  Patient is not a surgical candidate.  They have recommended conservative management -NG tube was pulled out as patient was feeling very uncomfortable -GI consult for any minimally invasive procedures or conservative management. -Status post palliative care service evaluation -Hospice services at home will be arranged -Possible discharge in a.m. with home hospice services -Discussed with patient's family  2.  Glaucoma-restart home drops  3.  GERD-start oral Protonix  4.  DVT prophylaxis-on Lovenox  5.  Start clear liquid diet  All the records are reviewed and case discussed with Care Management/Social Workerr. Management plans discussed with the patient, family and they are in agreement.  CODE STATUS: DNR  TOTAL TIME TAKING CARE OF THIS PATIENT: 35 minutes.   POSSIBLE D/C IN 1 DAYS, DEPENDING ON CLINICAL CONDITION.   Saundra Shelling M.D on 07/12/2018 at 2:52 PM  Between 7am to 6pm - Pager -  [phone]  After 6pm go to [email or web address] - password [email or web address] [facility]  Office  [phone]  CC: Primary care physician; Albina Billet, MD

## 2018-07-12 NOTE — Progress Notes (Signed)
New referral for [facility] services at home received from [place] following a Palliative Medicine consult. Patient is a 84 year old woman with a past medical history of dementia, cardiomyopathy, CHF, hiatal hernia, renal artery aneurysm, nephrolithiasis, breast cancer, mixed urge and stress incontinence, and knee/lower back pain, admitted to [facility] on 12/17 with complaints of chest pain and shortness of breath. Chest and abdominal CT revealed a large hiatal hernia.  Patient was seen by surgery and family declined surgical intervention and opted for conservative management, NG tube was placed and Palliative Medicine was consulted. Palliative NP Benito Mccreedy met with patient's daughter Olivia Mackie yesterday, plan is for discharge home with the support of hospice services.  Writer met in the room with Olivia Mackie and her father in law to initiate education regarding hospice serivces, philosophy and team approach to care with understanding voiced. Questions answered. Patient has a hospital bed in place and has both a borrowed walker and BSC, plan is for replacement of DME following admission to hospice services. Per Olivia Mackie she will transport her mother home by car tomorrow afternoon. Please send signed out of facility DNR home with patient.  Patient was alert and able to greet this RN. She was oriented only to self through out the visit. Patient information faxed to referral. Hospital care team updated. Flo Shanks RN, BSN, [place] and [facility], hospital Liaison [phone]

## 2018-07-13 MED ORDER — MORPHINE SULFATE (CONCENTRATE) 10 MG/0.5ML PO SOLN: 2.5000 mg | ORAL | 0 refills | Status: AC | PRN

## 2018-07-13 MED ORDER — LORAZEPAM 0.5 MG PO TABS: 0.5000 mg | ORAL_TABLET | Freq: Four times a day (QID) | ORAL | 0 refills | Status: AC | PRN

## 2018-07-13 MED ORDER — MORPHINE SULFATE (CONCENTRATE) 10 MG/0.5ML PO SOLN
2.5000 mg | ORAL | Status: DC | PRN
  Filled 2018-07-13: qty 1

## 2018-07-13 MED ORDER — LORAZEPAM 0.5 MG PO TABS: 0.5000 mg | ORAL_TABLET | Freq: Four times a day (QID) | ORAL | Status: DC | PRN

## 2018-07-13 MED ORDER — OXYCODONE HCL 5 MG PO TABS
5.0000 mg | ORAL_TABLET | Freq: Four times a day (QID) | ORAL | Status: DC | PRN
  Administered 2018-07-13 (×2): 5 mg via ORAL
  Filled 2018-07-13 (×2): qty 1

## 2018-07-13 NOTE — Care Management Important Message (Signed)
Copy of signed IM left with patient in room.  

## 2018-07-13 NOTE — Progress Notes (Signed)
Loney Hering Desai to be D/C'd home with hospice care per MD order.  Discussed prescriptions and follow up appointments with the patient. Prescriptions given to patient, medication list explained in detail. Pt verbalized understanding.  Allergies as of 07/13/2018      Reactions   Nitrofurantoin Nausea And Vomiting   Sulfa Antibiotics Hives, Rash   Aspirin Other (See Comments)   Reaction: feels like she is having a heart attack/ because of hiatal hernia   Ciprofloxacin Other (See Comments)   Other reaction(s): Other (See Comments) Per patient she get upset stomach and dizziness.    Ibuprofen Other (See Comments)   Other reaction(s): Other (See Comments) Because of hiatal hernia    Penicillins Other (See Comments)   Has patient had a PCN reaction causing immediate rash, facial/tongue/throat swelling, SOB or lightheadedness with hypotension: Unknown Has patient had a PCN reaction causing severe rash involving mucus membranes or skin necrosis: Unknown Has patient had a PCN reaction that required hospitalization: Unknown Has patient had a PCN reaction occurring within the last 10 years: Unknown If all of the above answers are "NO", then may proceed with Cephalosporin use.      Medication List    STOP taking these medications   acetaminophen 500 MG tablet Commonly known as:  TYLENOL   fluconazole 100 MG tablet Commonly known as:  DIFLUCAN   furosemide 20 MG tablet Commonly known as:  LASIX   potassium chloride 10 MEQ tablet Commonly known as:  K-DUR,KLOR-CON   tamsulosin 0.4 MG Caps capsule Commonly known as:  FLOMAX     TAKE these medications   dorzolamide-timolol 22.3-6.8 MG/ML ophthalmic solution Commonly known as:  COSOPT Place 1 drop into the right eye 2 (two) times daily.   LORazepam 0.5 MG tablet Commonly known as:  ATIVAN Take 1 tablet (0.5 mg total) by mouth every 6 (six) hours as needed for anxiety.   LUMIGAN 0.01 % Soln Generic drug:  bimatoprost Place 1 drop into  both eyes at bedtime.   metroNIDAZOLE 0.75 % cream Commonly known as:  METROCREAM Apply 1 application topically daily. Apply to face   morphine CONCENTRATE 10 MG/0.5ML Soln concentrated solution Take 0.13-0.25 mLs (2.6-5 mg total) by mouth every 4 (four) hours as needed for severe pain, anxiety or shortness of breath.   multivitamin with minerals Tabs tablet Take 1 tablet by mouth daily.   omeprazole 20 MG capsule Commonly known as:  PRILOSEC Take 20 mg by mouth daily.   PREMARIN vaginal cream Generic drug:  conjugated estrogens Place 1 application vaginally 2 (two) times a week.       Vitals:   07/13/18 0814 07/13/18 1140  BP: (!) 151/80 135/77  Pulse: 98 96  Resp: (!) 28   Temp:  97.6 F (36.4 C)  SpO2: 95% 98%    Skin clean, dry and intact without evidence of skin break down, no evidence of skin tears noted. IV catheter discontinued intact. Site without signs and symptoms of complications. Dressing and pressure applied. Pt denies pain at this time. No complaints noted.  An After Visit Summary was printed and given to the patient. Patient escorted via [place], and D/C home via private auto. Daughter rto call hospice nurse once arrived home  Vicky Mccanless A Ebrima Ranta

## 2018-07-13 NOTE — Discharge Summary (Signed)
Kelly Mcbride at [place] NAME: Kelly Mcbride    MR#:  [id number]  DATE OF BIRTH:  1922/08/15  DATE OF ADMISSION:  07/10/2018 ADMITTING PHYSICIAN: Kelly Shelling, MD  DATE OF DISCHARGE: 07/13/2018  PRIMARY CARE PHYSICIAN: Kelly Billet, MD   ADMISSION DIAGNOSIS:  Dyspnea, unspecified type [R06.00] Large hiatal hernia [K44.9] Gastroparesis Cardiomyopathy DISCHARGE DIAGNOSIS:  Active Problems:   Hiatal hernia Dyspnea secondary to large hiatal hernia Nausea and vomiting Gastroparesis  SECONDARY DIAGNOSIS:   Past Medical History:  Diagnosis Date  . Aneurysm of renal artery ([place]) 01/10/2017  . 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 Breast cancer ([address]) 01/10/2017  . Cardiomyopathy, secondary ([place]) 06/07/2017  . CHF (congestive heart failure) ([place]) 06/07/2017  . Incomplete emptying of bladder 01/29/2014  . Kidney cyst, acquired 01/10/2017  . Knee pain 04/28/2016  . Low back pain 09/25/2012  . Mixed urge and stress incontinence 09/25/2012  . Nephrolithiasis 01/10/2017     ADMITTING HISTORY Kelly Mcbride  is a 84 y.o. female with a known history of renal artery aneurysm, cardiomyopathy, chronic congestive heart failure, hiatal hernia, nephrolithiasis was referred from cardiology office for cough and congestion.  Patient had some difficulty breathing for the last couple of days.  She is increased cough with sputum production and abdominal discomfort.  Patient states she has been bloated.  Patient was worked up in the emergency room with CT chest and abdomen which revealed a large hiatal hernia.  Case was discussed with surgical attending by ER physician.  NG tube with suction was plan for conservative management.  Patient's family does not want any aggressive measures such as surgery for her hiatal hernia.  BNP is normal.  Patient has poor oral intake.  Dependent on activities of daily living.  She is DNR by CODE STATUS.  HOSPITAL COURSE:  Patient was  admitted to medical floor.. Initially NG tube was placed and connected to suction.  Patient was kept n.p.o. and hydrated with IV fluids.  Palliative care service team evaluated the patient during hospitalization.  She was also evaluated by surgical team but no acute intervention recommended as patient is not a surgical candidate with multiple medical problems and advanced age.  Patient's family also did not want any aggressive measures.  NG tube suction was discontinued and patient started on clear liquid diet.  Home hospice services have been established.  Long-term prognosis is poor.  Patient will be discharged home with hospice services.  Overall medical condition and prognosis has been discussed with patient's family.  CONSULTS OBTAINED:  Palliative care Surgery  DRUG ALLERGIES:   Allergies  Allergen Reactions  . Nitrofurantoin Nausea And Vomiting  . Sulfa Antibiotics Hives and Rash  . Aspirin Other (See Comments)    Reaction: feels like she is having a heart attack/ because of hiatal hernia  . Ciprofloxacin Other (See Comments)    Other reaction(s): Other (See Comments) Per patient she get upset stomach and dizziness.   . Ibuprofen Other (See Comments)    Other reaction(s): Other (See Comments) Because of hiatal hernia   . Penicillins Other (See Comments)    Has patient had a PCN reaction causing immediate rash, facial/tongue/throat swelling, SOB or lightheadedness with hypotension: Unknown Has patient had a PCN reaction causing severe rash involving mucus membranes or skin necrosis: Unknown Has patient had a PCN reaction that required hospitalization: Unknown Has patient had a PCN reaction occurring within the last 10 years: Unknown If all of  the above answers are "NO", then may proceed with Cephalosporin use.     DISCHARGE MEDICATIONS:   Allergies as of 07/13/2018      Reactions   Nitrofurantoin Nausea And Vomiting   Sulfa Antibiotics Hives, Rash   Aspirin Other (See  Comments)   Reaction: feels like she is having a heart attack/ because of hiatal hernia   Ciprofloxacin Other (See Comments)   Other reaction(s): Other (See Comments) Per patient she get upset stomach and dizziness.    Ibuprofen Other (See Comments)   Other reaction(s): Other (See Comments) Because of hiatal hernia    Penicillins Other (See Comments)   Has patient had a PCN reaction causing immediate rash, facial/tongue/throat swelling, SOB or lightheadedness with hypotension: Unknown Has patient had a PCN reaction causing severe rash involving mucus membranes or skin necrosis: Unknown Has patient had a PCN reaction that required hospitalization: Unknown Has patient had a PCN reaction occurring within the last 10 years: Unknown If all of the above answers are "NO", then may proceed with Cephalosporin use.      Medication List    STOP taking these medications   acetaminophen 500 MG tablet Commonly known as:  TYLENOL   fluconazole 100 MG tablet Commonly known as:  DIFLUCAN   furosemide 20 MG tablet Commonly known as:  LASIX   potassium chloride 10 MEQ tablet Commonly known as:  K-DUR,KLOR-CON   tamsulosin 0.4 MG Caps capsule Commonly known as:  FLOMAX     TAKE these medications   dorzolamide-timolol 22.3-6.8 MG/ML ophthalmic solution Commonly known as:  COSOPT Place 1 drop into the right eye 2 (two) times daily.   LORazepam 0.5 MG tablet Commonly known as:  ATIVAN Take 1 tablet (0.5 mg total) by mouth every 6 (six) hours as needed for anxiety.   LUMIGAN 0.01 % Soln Generic drug:  bimatoprost Place 1 drop into both eyes at bedtime.   metroNIDAZOLE 0.75 % cream Commonly known as:  METROCREAM Apply 1 application topically daily. Apply to face   morphine CONCENTRATE 10 MG/0.5ML Soln concentrated solution Take 0.13-0.25 mLs (2.6-5 mg total) by mouth every 4 (four) hours as needed for severe pain, anxiety or shortness of breath.   multivitamin with minerals Tabs  tablet Take 1 tablet by mouth daily.   omeprazole 20 MG capsule Commonly known as:  PRILOSEC Take 20 mg by mouth daily.   PREMARIN vaginal cream Generic drug:  conjugated estrogens Place 1 application vaginally 2 (two) times a week.       Today  Patient seen today Awake and responds to all verbal commands Hemodynamically stable Long-term prognosis poor Will be discharged home with hospice services  VITAL SIGNS:  Blood pressure 135/77, pulse 96, temperature 97.6 F (36.4 C), temperature source Oral, resp. rate (!) 28, height 5\' 3"  (1.6 m), weight 56.2 kg, SpO2 98 %.  I/O:    Intake/Output Summary (Last 24 hours) at 07/13/2018 1305 Last data filed at 07/13/2018 1005 Gross per 24 hour  Intake 1821.5 ml  Output -  Net 1821.5 ml    PHYSICAL EXAMINATION:  Physical Exam  GENERAL:  84 y.o.-year-old patient lying in the bed with no acute distress.  LUNGS: Normal breath sounds bilaterally, no wheezing, rales,rhonchi or crepitation. No use of accessory muscles of respiration.  CARDIOVASCULAR: S1, S2 normal. No murmurs, rubs, or gallops.  ABDOMEN: Soft, non-tender, non-distended. Bowel sounds present. No organomegaly or mass.  NEUROLOGIC: Moves all 4 extremities. PSYCHIATRIC: The patient is alert and oriented x 3.  SKIN: No obvious rash, lesion, or ulcer.   DATA REVIEW:   CBC Recent Labs  Lab 07/11/18 0415  WBC 7.8  HGB 15.6*  HCT 47.9*  PLT 259    Chemistries  Recent Labs  Lab 07/10/18 1729  07/12/18 0509  NA 138   < > 142  K 4.8   < > 4.1  CL 105   < > 112*  CO2 27   < > 24  GLUCOSE 161*   < > 86  BUN 22   < > 23  CREATININE 1.34*   < > 1.22*  CALCIUM 10.2   < > 9.7  AST 25  --   --   ALT 17  --   --   ALKPHOS 108  --   --   BILITOT 0.7  --   --    < > = values in this interval not displayed.    Cardiac Enzymes Recent Labs  Lab 07/10/18 1729  [place] <0.03    Microbiology Results  Results for orders placed or performed in visit on  03/16/18  Microscopic Examination     Status: Abnormal   Collection Time: 03/16/18  4:05 PM  Result Value Ref Range Status   WBC, UA 11-30 (A) 0 - 5 /hpf Final   RBC, UA 3-10 (A) 0 - 2 /hpf Final   Epithelial Cells (non renal) 0-10 0 - 10 /hpf Final   Mucus, UA Present (A) Not Estab. Final   Bacteria, UA Moderate (A) None seen/Few Final  CULTURE, URINE COMPREHENSIVE     Status: None   Collection Time: 03/16/18  4:12 PM  Result Value Ref Range Status   Urine Culture, Comprehensive Final report  Final   Organism ID, Bacteria Comment  Final    Comment: Mixed urogenital flora 10,000-25,000 colony forming units per mL     RADIOLOGY:  No results found.  Follow up with PCP in 1 week.  Management plans discussed with the patient, family and they are in agreement.  CODE STATUS: DNR    Code Status Orders  (From admission, onward)         Start     Ordered   07/10/18 2224  Do not attempt resuscitation (DNR)  Continuous    Question Answer Comment  In the event of cardiac or respiratory ARREST Do not call a "code blue"   In the event of cardiac or respiratory ARREST Do not perform Intubation, CPR, defibrillation or ACLS   In the event of cardiac or respiratory ARREST Use medication by any route, position, wound care, and other measures to relive pain and suffering. May use oxygen, suction and manual treatment of airway obstruction as needed for comfort.   Comments nurse may pronounce      07/10/18 2223        Code Status History    Date Active Date Inactive Code Status Order ID Comments User Context   03/03/2018 0147 03/04/2018 1717 DNR [id number]  Lance Coon, MD Inpatient   11/08/2017 1016 11/09/2017 [place] DNR [id number]  Loletha Grayer, MD Inpatient   11/07/2017 2231 11/08/2017 1016 Full Code [id number]  Amelia Jo, MD ED      TOTAL TIME TAKING CARE OF THIS PATIENT ON DAY OF DISCHARGE: more than 35 minutes.   Kelly Mcbride M.D on 07/13/2018 at 1:05 PM  Between 7am to 6pm  - Pager - [phone]  After 6pm go to [email or web address] - password [email or web address] [facility] [facility]  Office  [phone]  CC: Primary care physician; Kelly Billet, MD  Note: This dictation was prepared with Dragon dictation along with smaller phrase technology. Any transcriptional errors that result from this process are unintentional.

## 2018-08-25 DEATH — deceased

## 2019-04-16 IMAGING — DX DG LUMBAR SPINE COMPLETE 4+V
6 series · 6 of 6 positions shown · non-contrast
Comparison: None.

CLINICAL DATA: Fall, low back pain

EXAM:
LUMBAR SPINE - COMPLETE 4+ VIEW

[l-spine ap]
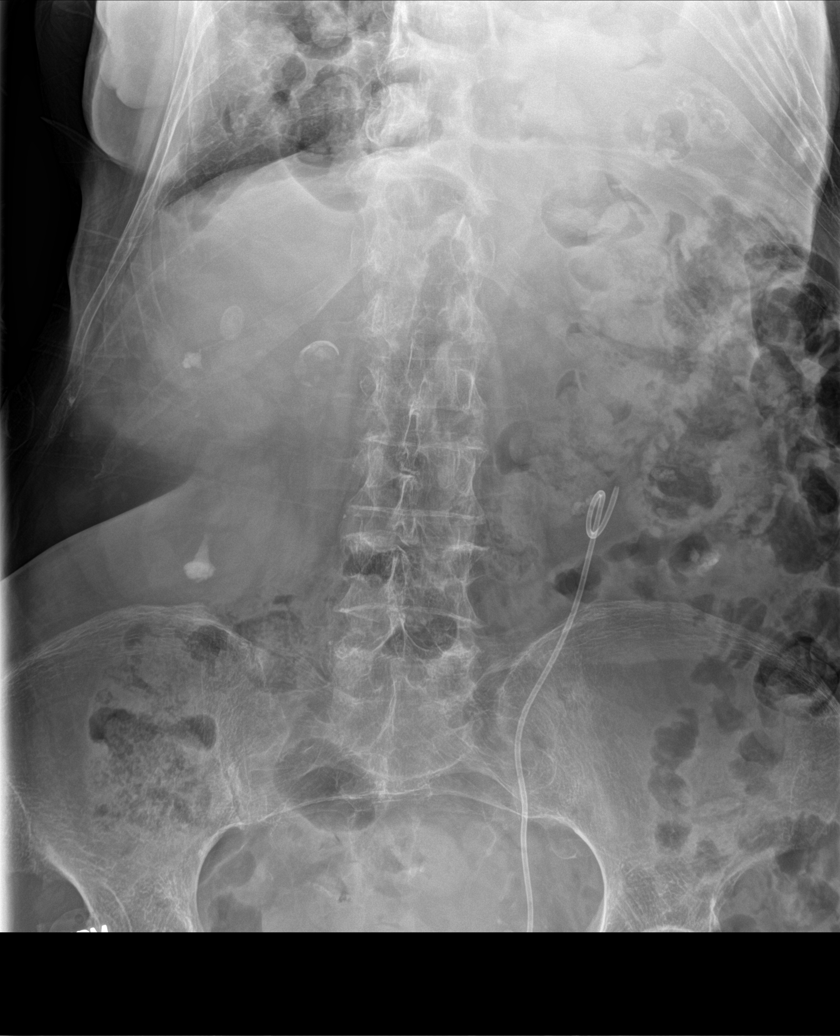

[l-spine obl (1 of 3)]
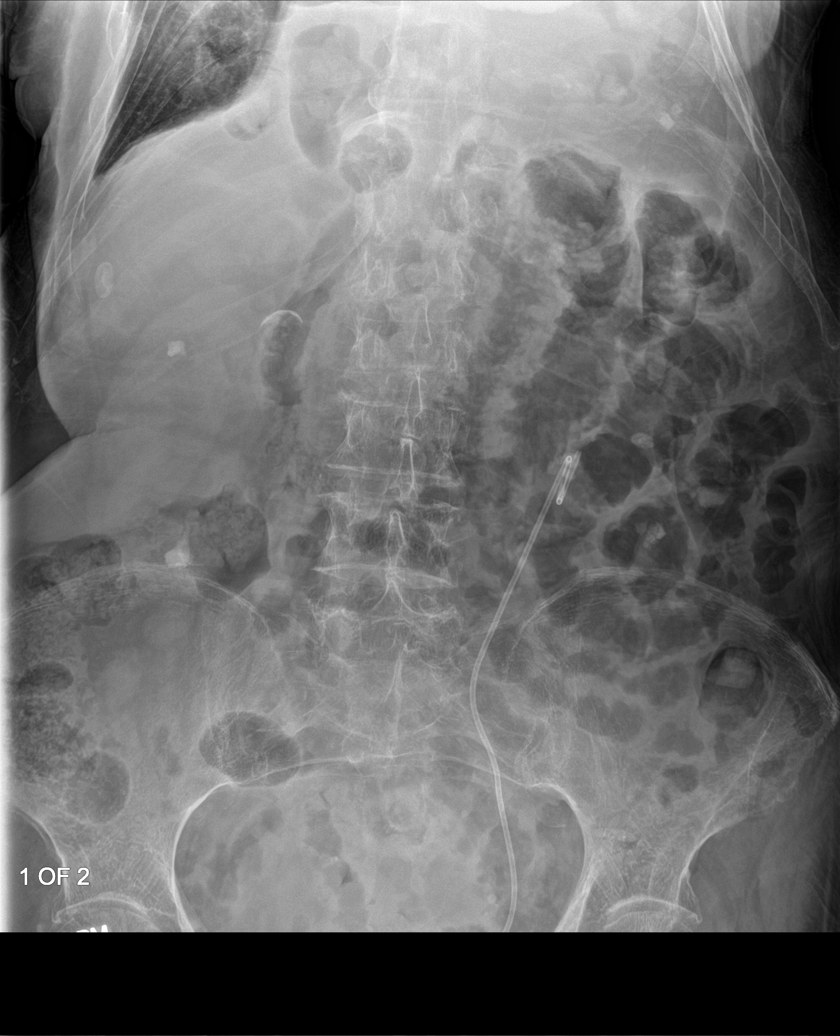

[l-spine obl (2 of 3)]
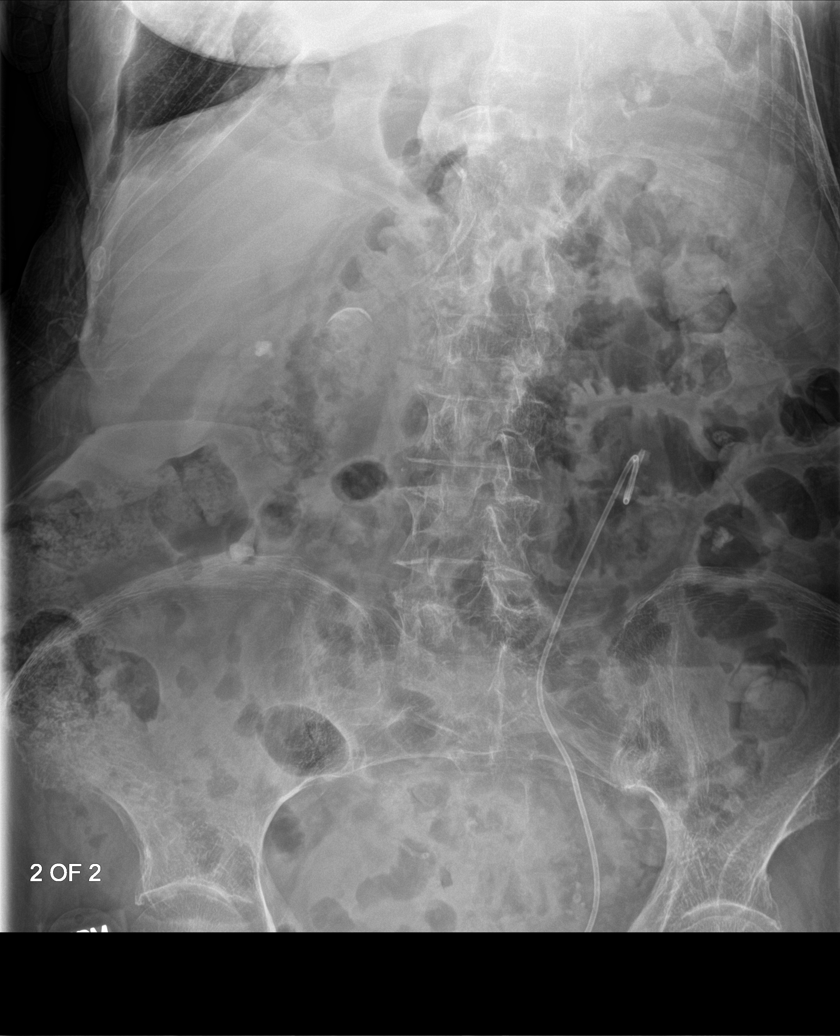

[l-spine lat]
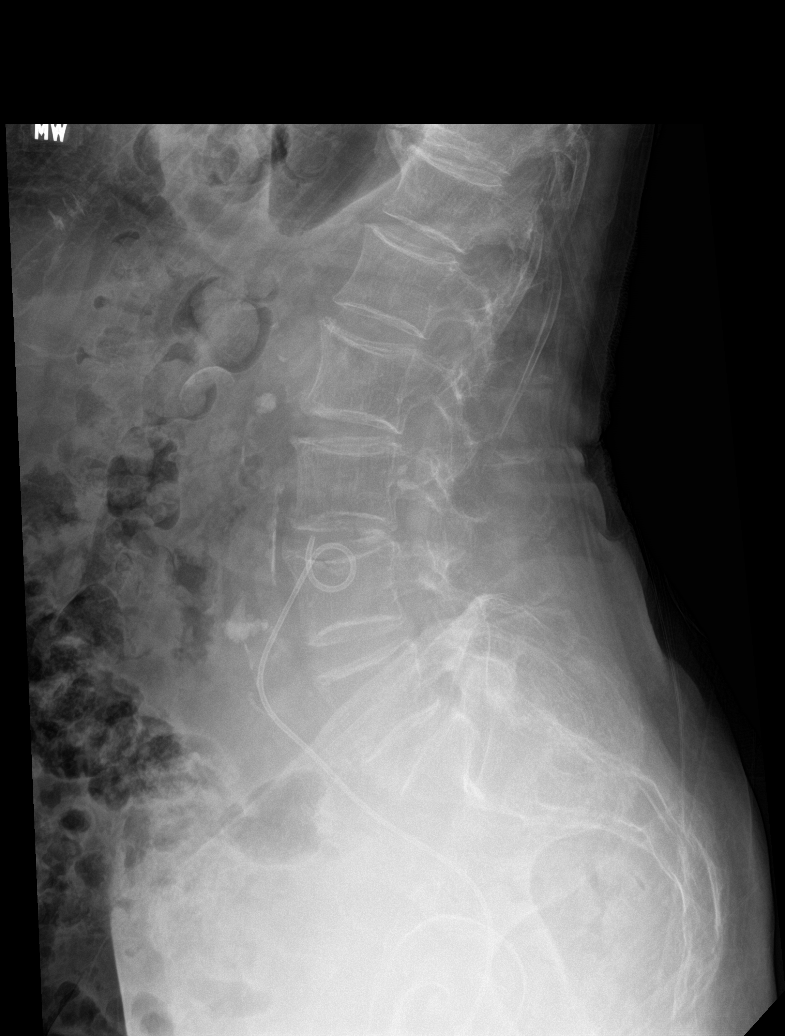

[l-spine spot]
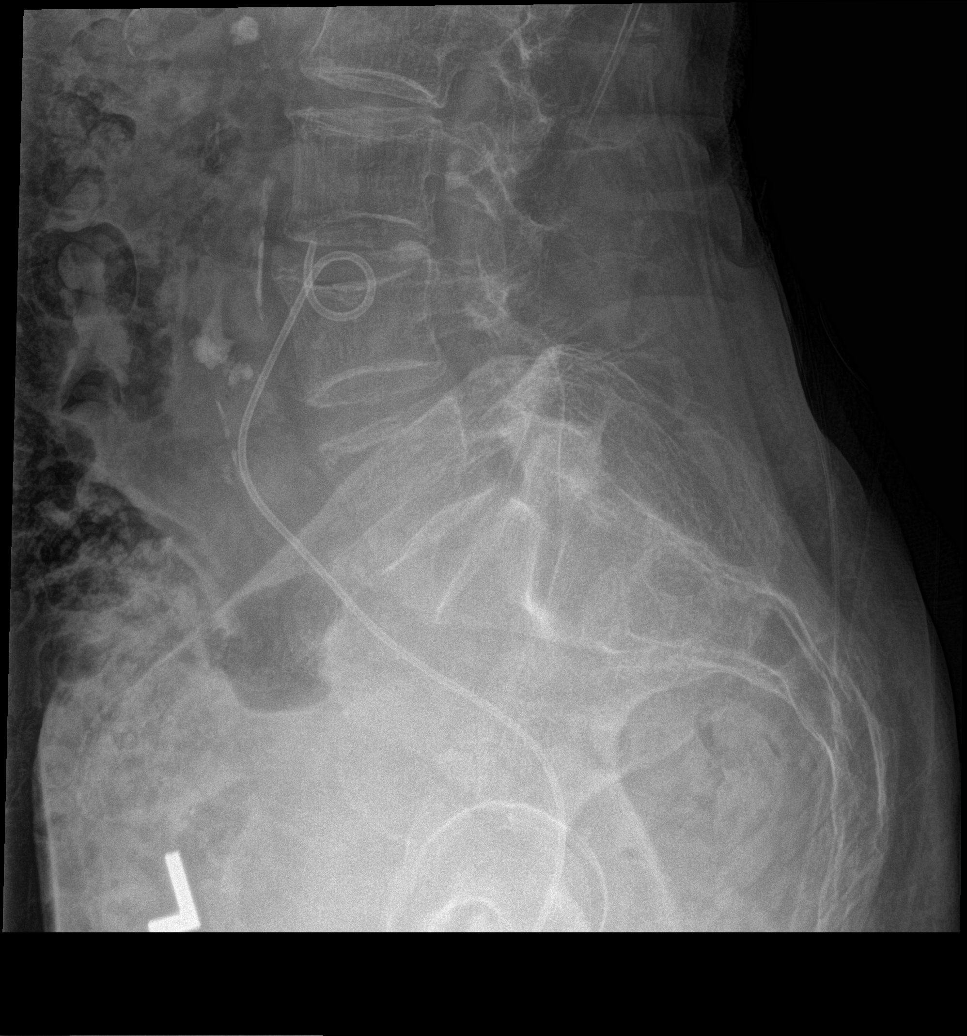

[l-spine obl (3 of 3)]
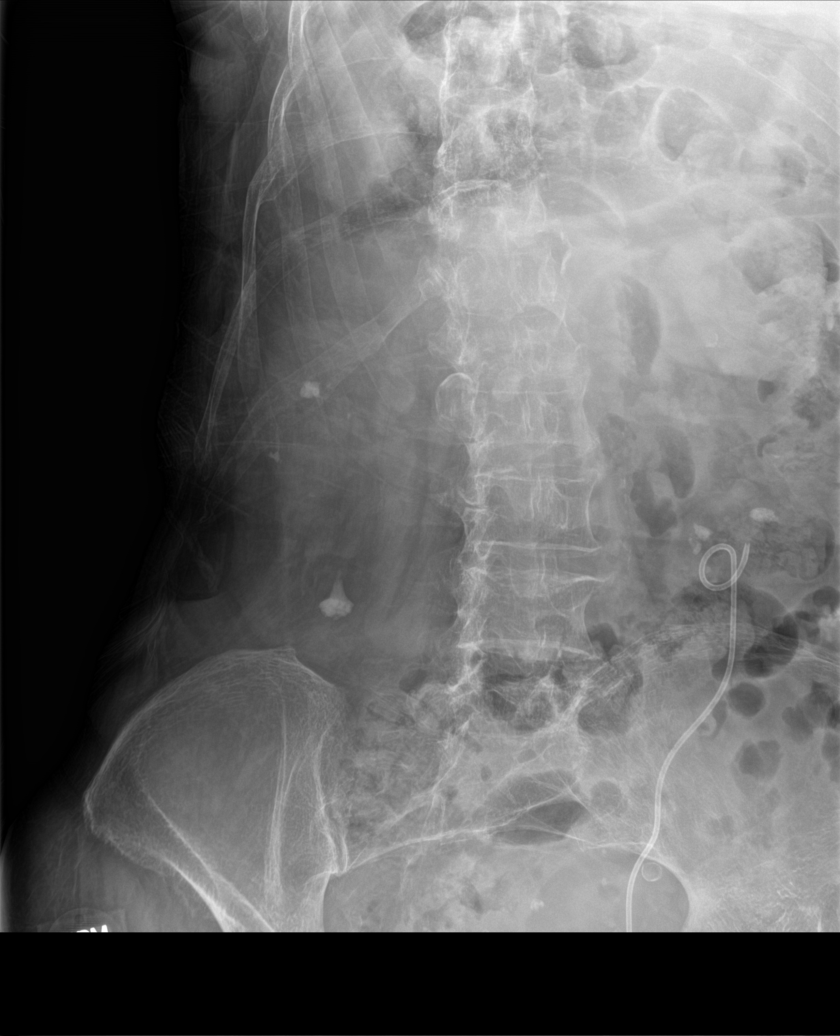

[6 of 6 positions shown; findings below may reference images not displayed]

FINDINGS: Diffuse osteopenia. There is cortical buckling in the lower sacrum
concerning for possible sacral fracture. No fracture in the lumbar
spine. No malalignment. SI joints are symmetric and unremarkable.

Bilateral nephrolithiasis noted.  Left ureteral stent in place.
IMPRESSION: Buckling of the anterior and posterior lower sacral cortex on the
lateral view concerning for sacral fracture.

Bilateral nephrolithiasis.
